# Patient Record
Sex: Female | Born: 1935 | Race: White | Hispanic: No | Smoking: Former smoker
Health system: Southern US, Community
[De-identification: ages and names within clinical notes are randomized; demographics above are authoritative.]

## PROBLEM LIST (undated history)

## (undated) DIAGNOSIS — J8410 Pulmonary fibrosis, unspecified: Secondary | ICD-10-CM

## (undated) DIAGNOSIS — M3500 Sicca syndrome, unspecified: Secondary | ICD-10-CM

## (undated) DIAGNOSIS — I10 Essential (primary) hypertension: Secondary | ICD-10-CM

## (undated) DIAGNOSIS — Z9049 Acquired absence of other specified parts of digestive tract: Secondary | ICD-10-CM

## (undated) DIAGNOSIS — K7460 Unspecified cirrhosis of liver: Secondary | ICD-10-CM

## (undated) DIAGNOSIS — M40209 Unspecified kyphosis, site unspecified: Secondary | ICD-10-CM

## (undated) HISTORY — DX: Unspecified cirrhosis of liver: K74.60

## (undated) HISTORY — DX: Acquired absence of other specified parts of digestive tract: Z90.49

## (undated) HISTORY — DX: Unspecified kyphosis, site unspecified: M40.209

## (undated) HISTORY — DX: Essential (primary) hypertension: I10

## (undated) HISTORY — DX: Sjogren syndrome, unspecified: M35.00

## (undated) HISTORY — DX: Pulmonary fibrosis, unspecified: J84.10

---

## 1996-01-21 HISTORY — PX: CATARACT EXTRACTION: SUR2

## 1996-01-21 HISTORY — PX: RETINAL DETACHMENT SURGERY: SHX105

## 2004-01-09 ENCOUNTER — Ambulatory Visit: Payer: Self-pay | Admitting: Unknown Physician Specialty

## 2004-02-15 ENCOUNTER — Ambulatory Visit: Payer: Self-pay | Admitting: Unknown Physician Specialty

## 2005-01-20 HISTORY — PX: BACK SURGERY: SHX140

## 2005-04-15 ENCOUNTER — Ambulatory Visit: Payer: Self-pay | Admitting: Unknown Physician Specialty

## 2005-08-25 ENCOUNTER — Ambulatory Visit: Payer: Self-pay | Admitting: Unknown Physician Specialty

## 2005-09-25 ENCOUNTER — Ambulatory Visit: Payer: Self-pay | Admitting: Pain Medicine

## 2005-10-01 ENCOUNTER — Encounter: Payer: Self-pay | Admitting: Neurological Surgery

## 2005-10-07 ENCOUNTER — Ambulatory Visit: Payer: Self-pay | Admitting: Pain Medicine

## 2005-10-20 ENCOUNTER — Encounter: Payer: Self-pay | Admitting: Neurological Surgery

## 2005-10-28 ENCOUNTER — Ambulatory Visit: Payer: Self-pay | Admitting: Neurological Surgery

## 2005-11-13 ENCOUNTER — Ambulatory Visit: Payer: Self-pay | Admitting: Neurological Surgery

## 2005-11-20 ENCOUNTER — Encounter: Payer: Self-pay | Admitting: Neurological Surgery

## 2005-12-05 ENCOUNTER — Inpatient Hospital Stay (HOSPITAL_COMMUNITY): Admission: RE | Admit: 2005-12-05 | Discharge: 2005-12-06 | Payer: Self-pay | Admitting: Neurological Surgery

## 2006-02-03 ENCOUNTER — Ambulatory Visit: Payer: Self-pay | Admitting: Ophthalmology

## 2006-02-10 ENCOUNTER — Ambulatory Visit: Payer: Self-pay | Admitting: Ophthalmology

## 2006-05-21 ENCOUNTER — Ambulatory Visit: Payer: Self-pay | Admitting: Unknown Physician Specialty

## 2006-07-14 ENCOUNTER — Ambulatory Visit: Payer: Self-pay | Admitting: Unknown Physician Specialty

## 2007-01-21 DIAGNOSIS — Z9049 Acquired absence of other specified parts of digestive tract: Secondary | ICD-10-CM

## 2007-01-21 HISTORY — PX: CHOLECYSTECTOMY: SHX55

## 2007-01-21 HISTORY — DX: Acquired absence of other specified parts of digestive tract: Z90.49

## 2007-05-01 IMAGING — CR DG LUMBAR SPINE 2-3V
2 series · 2 of 2 positions shown · non-contrast
Comparison: none

CLINICAL DATA: Stenosis.  L2-3 laminectomy.
 LUMBAR SPINE PORTABLE ? 2 VIEWS ? 12/05/05:

[view not recorded (1 of 2)]
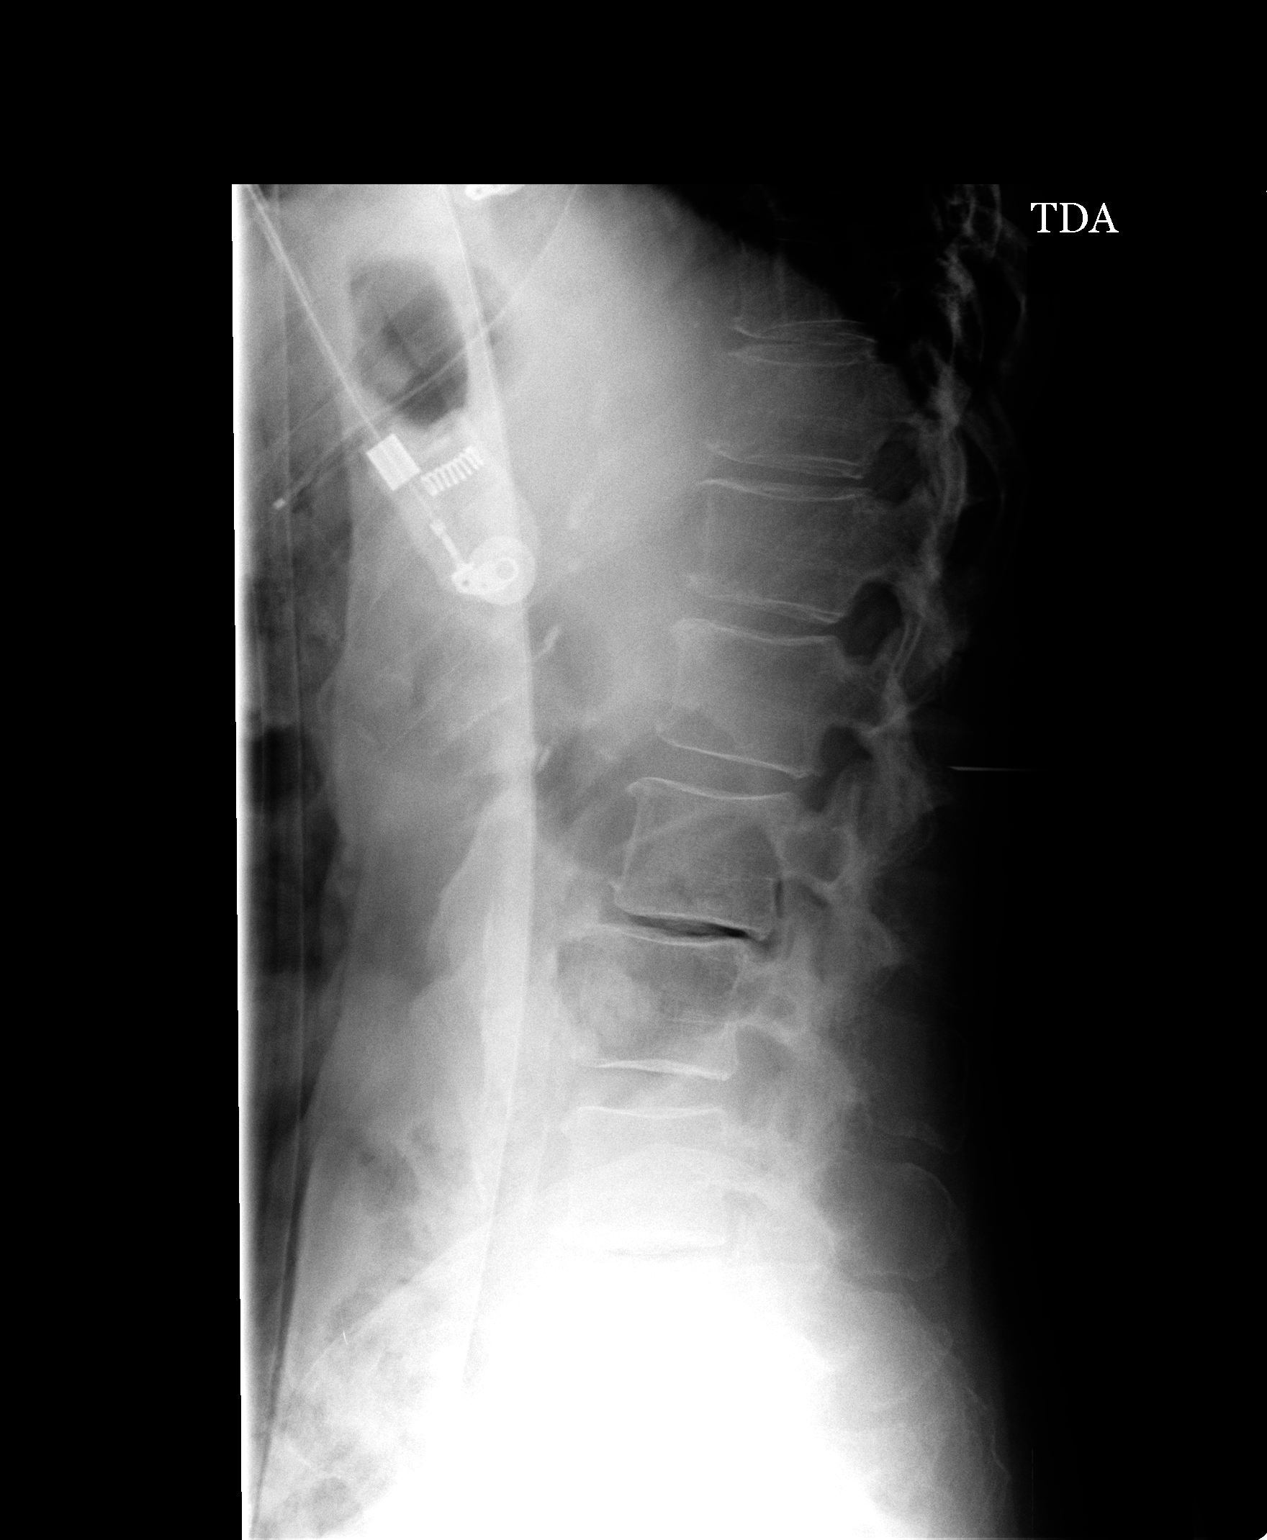

[view not recorded (2 of 2)]
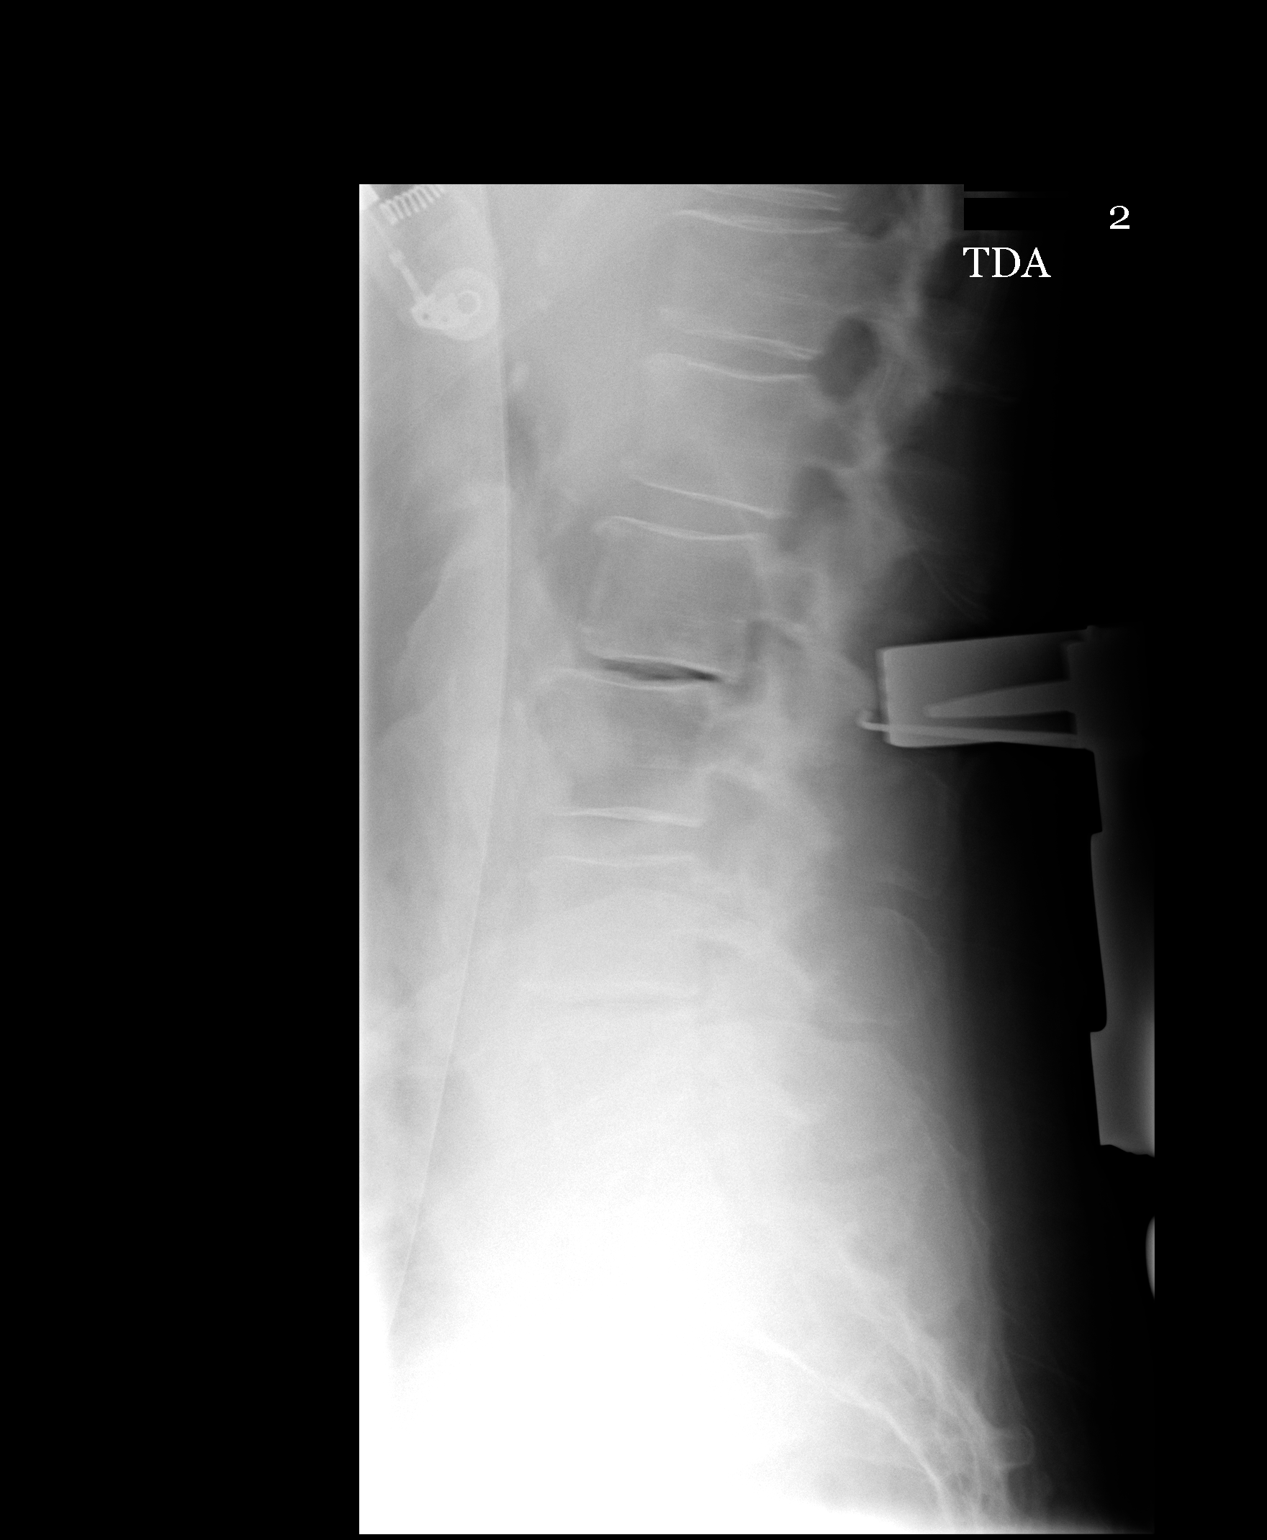

[2 of 2 positions shown; findings below may reference images not displayed]

FINDINGS: Film labeled #1 was taken at 9744 hours revealing a needle in the posterior soft tissues aimed at the inferior aspect of L1.
 Film labeled #2 was taken at 8083 hours revealing retractors in place with a surgical instrument pointer aimed at the L2-3 level.
IMPRESSION: OR localization at L2-3.

## 2007-05-24 ENCOUNTER — Ambulatory Visit: Payer: Self-pay | Admitting: Unknown Physician Specialty

## 2007-08-19 ENCOUNTER — Ambulatory Visit: Payer: Self-pay | Admitting: Unknown Physician Specialty

## 2007-09-30 ENCOUNTER — Ambulatory Visit: Payer: Self-pay | Admitting: Unknown Physician Specialty

## 2007-10-11 ENCOUNTER — Ambulatory Visit: Payer: Self-pay | Admitting: Cardiology

## 2007-10-11 ENCOUNTER — Other Ambulatory Visit: Payer: Self-pay

## 2007-10-11 ENCOUNTER — Ambulatory Visit: Payer: Self-pay | Admitting: Surgery

## 2007-10-15 ENCOUNTER — Ambulatory Visit: Payer: Self-pay | Admitting: Surgery

## 2008-01-25 ENCOUNTER — Ambulatory Visit: Payer: Self-pay | Admitting: Unknown Physician Specialty

## 2008-02-03 ENCOUNTER — Ambulatory Visit: Payer: Self-pay | Admitting: Unknown Physician Specialty

## 2008-02-23 LAB — PROTIME-INR

## 2008-05-24 ENCOUNTER — Encounter: Payer: Self-pay | Admitting: Internal Medicine

## 2008-05-24 ENCOUNTER — Ambulatory Visit: Payer: Self-pay | Admitting: Unknown Physician Specialty

## 2008-05-30 ENCOUNTER — Ambulatory Visit: Payer: Self-pay | Admitting: Unknown Physician Specialty

## 2008-09-06 ENCOUNTER — Ambulatory Visit: Payer: Self-pay | Admitting: Unknown Physician Specialty

## 2008-12-12 ENCOUNTER — Ambulatory Visit: Payer: Self-pay | Admitting: Unknown Physician Specialty

## 2008-12-29 ENCOUNTER — Encounter: Payer: Self-pay | Admitting: Unknown Physician Specialty

## 2009-01-20 ENCOUNTER — Encounter: Payer: Self-pay | Admitting: Unknown Physician Specialty

## 2009-01-20 HISTORY — PX: OTHER SURGICAL HISTORY: SHX169

## 2009-02-20 ENCOUNTER — Encounter: Payer: Self-pay | Admitting: Unknown Physician Specialty

## 2009-03-07 ENCOUNTER — Ambulatory Visit: Payer: Self-pay | Admitting: Unknown Physician Specialty

## 2009-05-15 ENCOUNTER — Encounter: Payer: Self-pay | Admitting: Internal Medicine

## 2009-05-17 ENCOUNTER — Ambulatory Visit: Payer: Self-pay | Admitting: Unknown Physician Specialty

## 2009-05-24 ENCOUNTER — Ambulatory Visit: Payer: Self-pay | Admitting: Unknown Physician Specialty

## 2009-06-22 ENCOUNTER — Ambulatory Visit: Payer: Self-pay | Admitting: Unknown Physician Specialty

## 2009-06-26 ENCOUNTER — Encounter: Payer: Self-pay | Admitting: Unknown Physician Specialty

## 2009-07-03 ENCOUNTER — Ambulatory Visit: Payer: Self-pay | Admitting: Unknown Physician Specialty

## 2009-07-10 ENCOUNTER — Ambulatory Visit: Payer: Self-pay | Admitting: Unknown Physician Specialty

## 2009-08-09 ENCOUNTER — Ambulatory Visit: Payer: Self-pay | Admitting: Unknown Physician Specialty

## 2009-08-18 ENCOUNTER — Ambulatory Visit: Payer: Self-pay | Admitting: Unknown Physician Specialty

## 2009-08-23 ENCOUNTER — Ambulatory Visit: Payer: Self-pay | Admitting: Unknown Physician Specialty

## 2009-11-20 ENCOUNTER — Encounter: Payer: Self-pay | Admitting: Unknown Physician Specialty

## 2009-12-20 ENCOUNTER — Encounter: Payer: Self-pay | Admitting: Unknown Physician Specialty

## 2009-12-20 ENCOUNTER — Encounter: Payer: Self-pay | Admitting: Internal Medicine

## 2009-12-26 ENCOUNTER — Encounter: Payer: Self-pay | Admitting: Internal Medicine

## 2010-01-01 ENCOUNTER — Ambulatory Visit: Payer: Self-pay | Admitting: Internal Medicine

## 2010-01-01 ENCOUNTER — Telehealth: Payer: Self-pay | Admitting: Internal Medicine

## 2010-01-01 DIAGNOSIS — M810 Age-related osteoporosis without current pathological fracture: Secondary | ICD-10-CM | POA: Insufficient documentation

## 2010-01-01 DIAGNOSIS — K7460 Unspecified cirrhosis of liver: Secondary | ICD-10-CM | POA: Insufficient documentation

## 2010-01-01 DIAGNOSIS — I10 Essential (primary) hypertension: Secondary | ICD-10-CM | POA: Insufficient documentation

## 2010-01-20 HISTORY — PX: BRONCHOSCOPY: SUR163

## 2010-01-22 ENCOUNTER — Ambulatory Visit
Admission: RE | Admit: 2010-01-22 | Discharge: 2010-01-22 | Payer: Self-pay | Source: Home / Self Care | Attending: Internal Medicine | Admitting: Internal Medicine

## 2010-01-22 ENCOUNTER — Encounter: Payer: Self-pay | Admitting: Internal Medicine

## 2010-01-30 ENCOUNTER — Other Ambulatory Visit: Payer: Self-pay | Admitting: Internal Medicine

## 2010-01-30 ENCOUNTER — Encounter: Payer: Self-pay | Admitting: Internal Medicine

## 2010-01-30 ENCOUNTER — Ambulatory Visit: Payer: Self-pay | Admitting: Internal Medicine

## 2010-01-30 ENCOUNTER — Ambulatory Visit
Admission: RE | Admit: 2010-01-30 | Discharge: 2010-01-30 | Payer: Self-pay | Source: Home / Self Care | Attending: Internal Medicine | Admitting: Internal Medicine

## 2010-01-30 DIAGNOSIS — J8410 Pulmonary fibrosis, unspecified: Secondary | ICD-10-CM | POA: Insufficient documentation

## 2010-01-30 LAB — SEDIMENTATION RATE: Sed Rate: 42 mm/hr — ABNORMAL HIGH (ref 0–22)

## 2010-02-08 ENCOUNTER — Encounter: Payer: Self-pay | Admitting: Internal Medicine

## 2010-02-14 LAB — CONVERTED CEMR LAB
ANA Titer 1: NEGATIVE
Anti Nuclear Antibody(ANA): POSITIVE — AB
Rheumatoid fact SerPl-aCnc: 38 intl units/mL — ABNORMAL HIGH (ref <=14)
Scleroderma (Scl-70) (ENA) Antibody, IgG: <1 (ref <30)

## 2010-02-15 ENCOUNTER — Telehealth: Payer: Self-pay | Admitting: Internal Medicine

## 2010-02-21 ENCOUNTER — Encounter: Payer: Self-pay | Admitting: Internal Medicine

## 2010-02-21 NOTE — Assessment & Plan Note (Signed)
Summary: sob/cb   Visit Type:  Initial Consult Copy to:  Dr. Jenkins Rouge, Primary Provider/Referring Provider:  Dr. Jenkins Rouge, [facility]  CC:  Pulmonary Consult for SOB  x 2 years. .  History of Present Illness: 77 year old female. Almost non-smoker.  Back surgery in 2007. Uses walker afters/p kyphoplasty in June and August 2011. . c/o dyspnea x 2 years. Insidious onset. Gradually progresive esp last  6 months. Brought on by exertion - suspects 1/4 - 1/2 block distance. Relieved by rest and also advair to an extent.  Rates dyspnea as moderate. Hx positive for wheezing for last several months - taking advair for several months. Advair helps wheeze and dyspnea.  Denies assoicated cough, chest pain, hemoptysis, syncope, palpitations, edema.  Of note, had significant worsening of dyspnea (without fever or sputum but only a mild cough) on 12/20/2009 and was diagnosed with pneumonia/wheezing per hx (CXR - from outside -  LLL infiltrate on 12/20/2009 that was possibly there on 05/15/2009 but definitely new since 12/02/2005 and 05/25/2000) and Rx with 1 week prednisone ending 12/26/2009 and short course abx ending 12/29/2009. She feels she is better with above Rx  Of note, does admit to unintenional weight loss 30# since diagnosis of idiopathic cirrhosis in 2007  Preventive Screening-Counseling & Management  Alcohol-Tobacco     Smoking Status: quit     Year Started: 1945     Year Quit: 1949     Passive Smoke Counseling: not indicated; no passive smoke exposure  Current Medications (verified): 1)  Vitamin B-12 Injection .... Once Monthly 2)  Norvasc 2.5 Mg Tabs (Amlodipine Besylate) .... Take 1 Tablet By Mouth Once A Day 3)  Maxzide-25 37.5-25 Mg Tabs (Triamterene-Hctz) .... 1/2 Tablet Daily 4)  Calcium-Vitamin D 250-125 Mg-Unit Tabs (Calcium Carbonate-Vitamin D) .... Take 1 Tablet By Mouth Two Times A Day 5)  Preservision/lutein  Caps (Multiple Vitamins-Minerals) .... Take 1 Tablet  By Mouth Two Times A Day 6)  Advair Diskus 100-50 Mcg/dose Aepb (Fluticasone-Salmeterol) .... One Puff Twice Daily 7)  Flonase 50 Mcg/act Susp (Fluticasone Propionate) .... As Needed 8)  Aleve 220 Mg Tabs (Naproxen Sodium) .... As Needed  Allergies (verified): No Known Drug Allergies  Past History:  Past Medical History: Osteoporosis  - IV reclast once  a year Cirrhosis.Marland KitchenMarland KitchenDr [facility]o [facility]ye [facility]  - Unknown etiolgoy. Bx proven   - On monitoring Rx Hypertension Back issues  Past Surgical History: back surgery-Dr. Marikay Alar, 2007 Retina repair-1998 Cataract 1998 Cholecystectomy-2009 Kyoplastic-2011  Family History: Father-diabetes Mother-diabetes Sister-died age 34 from complications with diabetes  Social History: Patient states former smoker.  started in 1945, stopped in  1949 avg  2 cigs per day.  Never ETOH. No drugs. Lives alone. No kids Widowed since 2000. Social support via siblings and nephew/neice Retired Charity fundraiser Smoking Status:  quit  Review of Systems       The patient complains of shortness of breath with activity, non-productive cough, and nasal congestion/difficulty breathing through nose.  The patient denies shortness of breath at rest, productive cough, coughing up blood, chest pain, irregular heartbeats, acid heartburn, indigestion, loss of appetite, weight change, abdominal pain, difficulty swallowing, sore throat, tooth/dental problems, headaches, sneezing, itching, ear ache, anxiety, depression, hand/feet swelling, joint stiffness or pain, rash, change in color of mucus, and fever.    Vital Signs:  Patient profile:   77 year old female Height:      65 inches Weight:      163.50 pounds BMI:  27.31 O2 Sat:      90 % on Room air Temp:     98.4 degrees F oral Pulse rate:   97 / minute BP sitting:   130 / 78  (right arm) Cuff size:   regular  Vitals Entered By: Carron Curie CMA (January 01, 2010 2:32 PM)  O2 Flow:  Room air  Serial Vital  Signs/Assessments:  Comments: Ambulatory Pulse Oximetry  Resting; HR__93___    02 Sat__91___  Lap1 (185 feet)   HR__88___   02 Sat___108__ Lap2 (185 feet)   HR_____   02 Sat_____    Lap3 (185 feet)   HR_____   02 Sat_____  ___Test Completed without Difficulty __x_Test Stopped due to:pt desaturate to 88% on first lap. Placed pt on 2 liters o2 and sats increased to 92%.   By: Carron Curie CMA   CC: Pulmonary Consult for SOB  x 2 years.  Comments Medications reviewed with patient Carron Curie CMA  January 01, 2010 2:42 PM Daytime phone number verified with patient.    Physical Exam  General:  well developed, well nourished, in no acute distress pleasant uses walker Head:  normocephalic and atraumatic Eyes:  PERRLA/EOM intact; conjunctiva and sclera clear Ears:  TMs intact and clear with normal canals Nose:  no deformity, discharge, inflammation, or lesions Mouth:  no deformity or lesions Neck:  no masses, thyromegaly, or abnormal cervical nodes Chest Wall:  no deformities noted Lungs:  decreased BS on L and dullness L base.   some squeeks and end exp wheeze on left base no distress Heart:  regular rate and rhythm, S1, S2 without murmurs, rubs, gallops, or clicks Abdomen:  bowel sounds positive; abdomen soft and non-tender without masses, or organomegaly Msk:  uses walker Pulses:  pulses normal Extremities:  no clubbing, cyanosis, edema, or deformity noted Neurologic:  CN II-XII grossly intact with normal reflexes, coordination, muscle strength and tone Skin:  intact without lesions or rashes Cervical Nodes:  no significant adenopathy Axillary Nodes:  no significant adenopathy Psych:  alert and cooperative; normal mood and affect; normal attention span and concentration   CXR  Procedure date:  12/20/2009  Findings:      (CXR - from outside -  LLL infiltrate on 12/20/2009 that was possibly there on 05/15/2009 but definitely new since 12/02/2005 and  05/25/2000  Impression & Recommendations:  Problem # 1:  DYSPNEA (ICD-786.05) Assessment New unclear cause in this is 77 year old almost non-smoker. Desaturates with exrtion.   plan full PFT Orders: Pulmonary Referral (Pulmonary) Radiology Referral (Radiology) Consultation Level V [phone])  Problem # 2:  UNSPECIFIED RESPIRATORY ABNORMALITY (ICD-786.00) Assessment: New I believe cxr shows non-resolving LLL infitlrate since April 2011 (my personal opinion). CXR was clear of these infitlrates in 2007 and 2002. She does not have dysphagia. She has cryptogenic cirrhosis but appears well comoensated. She  has 30# unitentional weight loss in 5 years  plan get ct chest wihtout contrast (depending on finding, will get SUPER D PROTOCOL Disc made if needed) rov after above Orders: Pulmonary Referral (Pulmonary) Radiology Referral (Radiology) Consultation Level V [id number])  Medications Added to Medication List This Visit: 1)  Vitamin B-12 Injection  .... Once monthly 2)  Norvasc 2.5 Mg Tabs (Amlodipine besylate) .... Take 1 tablet by mouth once a day 3)  Maxzide-25 37.5-25 Mg Tabs (Triamterene-hctz) .... 1/2 tablet daily 4)  Calcium-vitamin D 250-125 Mg-unit Tabs (Calcium carbonate-vitamin d) .... Take 1 tablet by mouth two times a day 5)  Preservision/lutein Caps (Multiple vitamins-minerals) .... Take 1 tablet by mouth two times a day 6)  Advair Diskus 100-50 Mcg/dose Aepb (Fluticasone-salmeterol) .... One puff twice daily 7)  Flonase 50 Mcg/act Susp (Fluticasone propionate) .... As needed 8)  Aleve 220 Mg Tabs (Naproxen sodium) .... As needed  Patient Instructions: 1)  pleaes have CT chest [facility] 2)  please have full PFTs 3)  return to see me after above in early Jan 2012 4)  Marvia Pickles and Happy New year 5)  If you feel  you are getting worse call us anytime at [phone]   Immunization History:  Influenza Immunization History:    Influenza:  historical  (11/20/2009)  Pneumovax Immunization History:    Pneumovax:  historical (09/23/2004)

## 2010-02-21 NOTE — Letter (Signed)
Summary: Francia Greaves MD/[facility]  Francia Greaves MD/[facility]   Imported By: Lester [place] 01/16/2010 07:50:00  _____________________________________________________________________  External Attachment:    Type:   Image     Comment:   External Document

## 2010-02-21 NOTE — Miscellaneous (Signed)
Summary: Orders Update pft charges  Clinical Lists Changes  Orders: Added new Service order of Carbon Monoxide diffusing w/capacity (94720) - Signed Added new Service order of Lung Volumes (94240) - Signed Added new Service order of Spirometry (Pre & Post) (94060) - Signed 

## 2010-02-21 NOTE — Assessment & Plan Note (Addendum)
Summary: F/U LLL INFILTRATE,SOB/RJC   Visit Type:  Follow-up Copy to:  Dr. Jenkins Rouge, Primary Provider/Referring Provider:  Dr. Jenkins Rouge, [facility]  CC:  Pt here for follow-up to review CT results and PFT results. .  History of Present Illness: 77 year old female. Almost non-smoker.  Idiopathich Cirrhosis since 2007 followed at [facility]. Chronic back issues. Uses walker since  s/p kyphoplasty in June and August 2011. .   c/o dyspnea x 2 years. Insidious onset. Gradually progresive esp last  6 months. Brought on by exertion - suspects 1/4 - 1/2 block distance. Relieved by rest and also advair to an extent.  Rates dyspnea as moderate. Hx positive for wheezing for last several months - taking advair for several months. Advair helps wheeze and dyspnea.  Had significant worsening of dyspnea (without fever or sputum but only a mild cough) on 12/20/2009 and was diagnosed with pneumonia/wheezing per hx. CXR - from outside shhowed  LLL infiltrate on 12/20/2009. Treated with antibipotic and prednisone short courses ending 12/26/2009. Of note, the LLL infiltrate was possibly present on  05/15/2009 and is  definitely new since 12/02/2005 and 05/25/2000). Denies assoicated cough, chest pain, hemoptysis, syncope, palpitations, edema but admits to 30# weight loss since 2007.    January 30, 2010: folowup after CT chest and PFTs. No interim complatins. PFTs today show NSIP pattern of pulmonary fibrosis iwth RUL nodule and other scattered nodules. I compared this with a May 2010 CT scan chest from [facility] and today's fibrois is worse. PFts 01/22/2010 shows mixed obstruction -restriction on spiro wiht TLC 78% and DLCO 28%. These findings are compatible with her symptoms.    Preventive Screening-Counseling & Management  Alcohol-Tobacco     Smoking Status: quit     Packs/Day: 0.5     Year Started: 1945     Year Quit: 1949     Passive Smoke Counseling: not indicated; no passive smoke exposure  Current  Medications (verified): 1)  Vitamin B-12 Injection .... Once Monthly 2)  Norvasc 2.5 Mg Tabs (Amlodipine Besylate) .... Take 1 Tablet By Mouth Once A Day 3)  Maxzide-25 37.5-25 Mg Tabs (Triamterene-Hctz) .... 1/2 Tablet Daily 4)  Calcium-Vitamin D 250-125 Mg-Unit Tabs (Calcium Carbonate-Vitamin D) .... Take 1 Tablet By Mouth Two Times A Day 5)  Preservision/lutein  Caps (Multiple Vitamins-Minerals) .... Take 1 Tablet By Mouth Two Times A Day 6)  Advair Diskus 100-50 Mcg/dose Aepb (Fluticasone-Salmeterol) .... One Puff Twice Daily 7)  Flonase 50 Mcg/act Susp (Fluticasone Propionate) .... As Needed 8)  Aleve 220 Mg Tabs (Naproxen Sodium) .... As Needed  Allergies (verified): No Known Drug Allergies  Past History:  Past medical, surgical, family and social histories (including risk factors) reviewed, and no changes noted (except as noted below).  Past Medical History: Osteoporosis  - IV reclast once  a year Cirrhosis.Marland KitchenMarland KitchenDr Margo [facility]ye at [facility]  - Unknown etiolgoy. Bx proven   - On monitoring Rx Hypertension Back issues with kyphosis  - s/p kyphoplasty   Past Surgical History: Reviewed history from 01/01/2010 and no changes required. back surgery-Dr. Marikay Alar, 2007 Retina repair-1998 Cataract 1998 Cholecystectomy-2009 Kyoplastic-2011  Family History: Reviewed history from 01/01/2010 and no changes required. Father-diabetes Mother-diabetes Sister-died age 62 from complications with diabetes  Social History: Reviewed history from 01/01/2010 and no changes required. Patient states former smoker.  started in 1945, stopped in  1949 avg  2 cigs per day.  Never ETOH. No drugs. Lives alone. No kids Widowed since 2000. Social support  via siblings and nephew/neice Retired Charity fundraiser Packs/Day:  0.5  Review of Systems  The patient denies shortness of breath with activity, shortness of breath at rest, productive cough, non-productive cough, coughing up blood, chest pain, irregular  heartbeats, acid heartburn, indigestion, loss of appetite, weight change, abdominal pain, difficulty swallowing, sore throat, tooth/dental problems, headaches, nasal congestion/difficulty breathing through nose, sneezing, itching, ear ache, anxiety, depression, hand/feet swelling, joint stiffness or pain, rash, change in color of mucus, and fever.    Vital Signs:  Patient profile:   77 year old female Height:      65 inches Weight:      162.50 pounds BMI:     27.14 O2 Sat:      94 % on Room air Temp:     97.4 degrees F oral Pulse rate:   72 / minute BP sitting:   140 / 82  (right arm) Cuff size:   regular  Vitals Entered By: Carron Curie CMA (January 30, 2010 2:03 PM)  O2 Flow:  Room air CC: Pt here for follow-up to review CT results and PFT results.  Comments Medications reviewed with patient Carron Curie CMA  January 30, 2010 2:06 PM Daytime phone number verified with patient.    Physical Exam  General:  well developed, well nourished, in no acute distress pleasant uses walker Head:  normocephalic and atraumatic Eyes:  PERRLA/EOM intact; conjunctiva and sclera clear Ears:  TMs intact and clear with normal canals Nose:  no deformity, discharge, inflammation, or lesions Mouth:  no deformity or lesions Neck:  no masses, thyromegaly, or abnormal cervical nodes Chest Wall:  no deformities noted Lungs:  decreased BS on L and dullness L base.   some squeeks and end exp wheeze on left base no distress Heart:  regular rate and rhythm, S1, S2 without murmurs, rubs, gallops, or clicks Abdomen:  bowel sounds positive; abdomen soft and non-tender without masses, or organomegaly Msk:  uses walker Pulses:  pulses normal Extremities:  no clubbing, cyanosis, edema, or deformity noted Neurologic:  CN II-XII grossly intact with normal reflexes, coordination, muscle strength and tone Skin:  intact without lesions or rashes Cervical Nodes:  no significant adenopathy Axillary  Nodes:  no significant adenopathy Psych:  alert and cooperative; normal mood and affect; normal attention span and concentration   MISC. Report  Procedure date:  01/30/2010  Findings:      pft and ct - see HPI  Impression & Recommendations:  Problem # 1:  PULMONARY FIBROSIS (ICD-515) Assessment New Clinically appears to have progressive diffuse parenchymal lung disease (dpld) or interstitial lung diseaes (ILD). The findings are NOT c/w UIP/IPF. Cause is not clear or precise type of fibrosis not clear.   PLAN woill get autoimune profile will d.w MTOC on 01/31/10 about potential bx options. Bx will help US reveal etiology but at age 37 and severe osteoporosis there is some risk for  VATS lung bx. Bronch bx (ENB method and BAL) less risky but yield also is less. Empiric steroid Rx or for that matter prolonged steroid Rx carries risk esp with age and osteoporosis. She is happy to go with whatever I recommend. So, I wil wait on autoimmune test results and also get our [facility] records for details oncirrhosis. Based on that review, I will consider Bronch in OR for biopsy and BAL  Other Orders: T-Angiotensin i-Converting Enzyme ([id number]) T-Antinuclear Antib (ANA) [phone]) T-Hypersens Panel ([id number]) T-Rheumatoid Factor [phone]) T- * Misc. Laboratory test [phone]) Radiology Referral (Radiology) TLB-Sedimentation Rate (  ESR) (85652-ESR) Est. Patient Level III (84696)  Patient Instructions: 1)  have blood work 2)  I will have discussion wiht other specialists 3)  I will call you back by early next week with answers 4)  followup depending on that

## 2010-02-21 NOTE — Miscellaneous (Signed)
Summary: records request  ---- Converted from flag ---- ---- 02/05/2010 5:32 PM, Carron Curie CMA wrote: contact numbers are [phone] or [phone]  ---- 02/04/2010 6:10 PM, Kalman Shan MD wrote: Dr Cornelia Copa at hematology dept of [facility]. Please get patient  INR, PT, PTT, most recent notes, liver biopsy report from this doctor ASAP. ------------------------------  request faxed to [facility] release of info at [phone], phone # is [phone]. request scanned.  Carron Curie CMA  February 08, 2010 2:36 PM

## 2010-02-21 NOTE — Progress Notes (Signed)
Summary: ct needs HRCT and need old one  Phone Note Outgoing Call   Summary of Call: revioewd oustide records. IT states that she had CT in MAy 2010  at [facility]. Please have her get that CD rom for me when she comes back for fu. also, this means that the CT we ordered needs to be HIGH RES CT CHEST. Pleaset tell [facility] Initial call taken by: Kalman Shan MD,  January 01, 2010 5:53 PM  Follow-up for Phone Call        spoke with the pt and advised she needed to get CD of CT from [facility] and bring to next appt. Pt states she will do so. I also called Rose and advised the CT for Jan needs to be a High Res CT chest. SHe has changed this in order.  Carron Curie CMA  January 03, 2010 3:42 PM

## 2010-02-22 ENCOUNTER — Telehealth (INDEPENDENT_AMBULATORY_CARE_PROVIDER_SITE_OTHER): Payer: Self-pay | Admitting: *Deleted

## 2010-02-27 ENCOUNTER — Encounter: Payer: Self-pay | Admitting: Internal Medicine

## 2010-02-27 NOTE — Progress Notes (Signed)
  Phone Note Other Incoming   Request: Send information Summary of Call: Request for records received from [facility]. Faxed 20 pages on 02/21/2010 to [phone].

## 2010-02-27 NOTE — Progress Notes (Signed)
Summary: set up appt with Dr. Saverio Danker  Phone Note Outgoing Call   Summary of Call: Candise Bowens,  a) need the records from [facility] b) lot of rheum antibidoes psoitive. I told patient that I want her to see her rheumatologoist again to interpret these tests. His name is Dr. Al Pimple of [facility]. You can have him call me on my cell anytime or page me with this contact coz I will be in ICU. Please call his office and make sure they give patient appt   Thanks MR Initial call taken by: Kalman Shan MD,  February 15, 2010 5:56 PM  Follow-up for Phone Call        I called [facility] at [phone] and asked to leave a message for Dr. Gavin Potters to call MR. Nurse states he does not sdo call backs that the MD will need to call [phone] and they will pull Dr. Gavin Potters out of the room to speak to him. ALso pt has an appt on 02-21-10 at 9 am.  I requested records from [facility] on 02-08-10. I will call to check on them later today. I have notified MR with contact number for Dr. Gavin Potters. Carron Curie CMA  February 18, 2010 9:35 AM   Additional Follow-up for Phone Call Additional follow up Details #1::        I spokle to Dr. Beverley Fiedler today and appraised him. He will review can call back with opinion. Please send all records including autoimmune profile results Additional Follow-up by: Kalman Shan MD,  February 19, 2010 3:39 PM    Additional Follow-up for Phone Call Additional follow up Details #2::    I refaxed request for records to [facility] again. i tried to call but was on hold for . Will try again. Carron Curie CMA  February 20, 2010 5:27 PM   Also spoke to dena in medical records and she is faxing all records to Dr. Gavin Potters now. Carron Curie CMA  February 21, 2010 2:32 PM

## 2010-03-04 ENCOUNTER — Telehealth: Payer: Self-pay | Admitting: Internal Medicine

## 2010-03-04 ENCOUNTER — Emergency Department: Payer: Self-pay | Admitting: Emergency Medicine

## 2010-03-06 ENCOUNTER — Encounter: Payer: Self-pay | Admitting: Internal Medicine

## 2010-03-12 ENCOUNTER — Ambulatory Visit: Payer: Self-pay

## 2010-03-13 NOTE — Progress Notes (Signed)
Summary: send autoimmne results to Dr. Gavin Potters > appt w/ MR 2.27.12  Phone Note Outgoing Call   Summary of Call: Candise Bowens, I was reading rheum notes of Dr Saverio Danker. he wants me to see her and discuss Rx. Pls give her appt to see me. No overbook. First avail is fine Initial call taken by: Kalman Shan MD,  March 04, 2010 2:14 AM  Follow-up for Phone Call        called spoke with patient, appt scheduled w/ MR 2.27.12 @ 1045.  pt okay with this date and time. Boone Master CNA/MA  March 05, 2010 5:12 PM

## 2010-03-13 NOTE — Letter (Signed)
Summary: Danielle Dess MD  Danielle Dess MD   Imported By: Lester [place] 03/08/2010 10:04:23  _____________________________________________________________________  External Attachment:    Type:   Image     Comment:   External Document

## 2010-03-13 NOTE — Letter (Signed)
Summary: Danielle Dess MD  Danielle Dess MD   Imported By: Lester [place] 03/08/2010 10:03:00  _____________________________________________________________________  External Attachment:    Type:   Image     Comment:   External Document

## 2010-03-18 ENCOUNTER — Encounter: Payer: Self-pay | Admitting: Internal Medicine

## 2010-03-18 ENCOUNTER — Ambulatory Visit (INDEPENDENT_AMBULATORY_CARE_PROVIDER_SITE_OTHER): Payer: Medicare Other | Admitting: Internal Medicine

## 2010-03-18 DIAGNOSIS — J8410 Pulmonary fibrosis, unspecified: Secondary | ICD-10-CM

## 2010-03-20 ENCOUNTER — Encounter: Payer: Self-pay | Admitting: Internal Medicine

## 2010-03-28 NOTE — Assessment & Plan Note (Addendum)
Summary: follow up per Dr. Earlene Plater and MR / [name]   Visit Type:  Follow-up Copy to:  Dr. Jenkins Rouge, Primary Provider/Referring Provider:  Dr. Jenkins Rouge, [facility], Dr Jones Broom- spine. Dr. Marchelle Gearing - Pulmonary, Dr. Mikey Bussing - Eye  CC:  follow-up to discuss appt with Dr. Gavin Potters. [name]  History of Present Illness: 77 year old female. Almost non-smoker.  Idiopathich Cirrhosis since 2007 followed at [facility]. Chronic back issues. Uses walker since  s/p kyphoplasty in June and August 2011. .   c/o dyspnea x 2 years. Insidious onset. Gradually progresive esp last  6 months. Brought on by exertion - suspects 1/4 - 1/2 block distance. Relieved by rest and also advair to an extent.  Rates dyspnea as moderate. Hx positive for wheezing for last several months - taking advair for several months. Advair helps wheeze and dyspnea.  Had significant worsening of dyspnea (without fever or sputum but only a mild cough) on 12/20/2009 and was diagnosed with pneumonia/wheezing per hx. CXR - from outside shhowed  LLL infiltrate on 12/20/2009. Treated with antibipotic and prednisone short courses ending 12/26/2009. Of note, the LLL infiltrate was possibly present on  05/15/2009 and is  definitely new since 12/02/2005 and 05/25/2000). Denies assoicated cough, chest pain, hemoptysis, syncope, palpitations, edema but admits to 30# weight loss since 2007.    January 30, 2010: folowup after CT chest and PFTs. No interim complatins. PFTs today show NSIP pattern of pulmonary fibrosis iwth RUL nodule and other scattered nodules. I compared this with a May 2010 CT scan chest from [facility] and today's fibrois is worse. PFts 01/22/2010 shows mixed obstruction -restriction on spiro wiht TLC 78% and DLCO 28%. These findings are compatible with her symptoms.  REC: AUTOIMMUNE PROFILE    March 18, 2010: Follwoup ILD. Results show ,p-ANCA trace  positive at 1:20, RF 38 and positive, ACE >100 and positive, ANA POS but titer negative,  ssA strong positive at 175 but ssB negative. Fungal antigen panel negative. She saw her rheumatologist Dr. Gavin Potters on 02/27/2010 - she denied sicca symptims and he felt that there was no other systemic manifestation of collagen vasc disease. He did feel that prednisone would hurt her osteoporosis. Today, she states persistent dyspnea and increased tirendess. Interim, one mo0re spontaneous vertebral fracture. Otherwise no issues but admits to chronic dry eyes and usage of artifical tears multiople times a day. Also, several mnths of dry mouth and need to drink lot of water. [name] Here to discuss next step in ILD workup and mgmt  Preventive Screening-Counseling & Management  Alcohol-Tobacco     Smoking Status: quit     Packs/Day: 0.5     Year Started: 1945     Year Quit: 1949     Passive Smoke Counseling: not indicated; no passive smoke exposure  Current Medications (verified): 1)  Vitamin B-12 Injection .... Once Monthly 2)  Norvasc 2.5 Mg Tabs (Amlodipine Besylate) .... Take 1 Tablet By Mouth Once A Day 3)  Maxzide-25 37.5-25 Mg Tabs (Triamterene-Hctz) .... 1/2 Tablet Daily As Needed For Ankle Swelling 4)  Calcium-Vitamin D 250-125 Mg-Unit Tabs (Calcium Carbonate-Vitamin D) .... Take 1 Tablet By Mouth Two Times A Day 5)  Preservision/lutein  Caps (Multiple Vitamins-Minerals) .... Take 1 Tablet By Mouth Two Times A Day 6)  Advair Diskus 100-50 Mcg/dose Aepb (Fluticasone-Salmeterol) .... One Puff Twice Daily 7)  Flonase 50 Mcg/act Susp (Fluticasone Propionate) .... As Needed 8)  Aleve 220 Mg Tabs (Naproxen Sodium) .... As Needed 9)  Hydrocodone-Acetaminophen 5-500 Mg Tabs (Hydrocodone-Acetaminophen) .[name].. 1-2 Tablets As Needed For Pain 10)  Cvs Gentle Lubricant Eye Drops 0.3 % Soln (Hypromellose) .... As Needed  Allergies (verified): No Known Drug Allergies  Past History:  Past medical, surgical, family and social histories (including risk factors) reviewed, and no changes noted (except as noted  below).  Past Medical History: Osteoporosis  - IV reclast once  a year Cirrhosis.Debbie KitchenMarland KitchenDr Margo Aye at [facility]  - Unknown etiolgoy. Bx proven   - On monitoring Rx Hypertension Back issues with kyphosis  - s/p kyphoplasty x 4  Past Surgical History: Reviewed history from 01/01/2010 and no changes required. back surgery-Dr. Marikay Alar, 2007 Retina repair-1998 Cataract 1998 Cholecystectomy-2009 Kyoplastic-2011  Family History: Reviewed history from 01/01/2010 and no changes required. Father-diabetes Mother-diabetes Sister-died age 31 from complications with diabetes  Social History: Reviewed history from 01/01/2010 and no changes required. Patient states former smoker.  started in 1945, stopped in  1949 avg  2 cigs per day.  Never ETOH. No drugs. Lives alone. No kids Widowed since 2000. Social support via siblings and nephew/neice Retired Charity fundraiser  Review of Systems       The patient complains of shortness of breath with activity and hand/feet swelling.  The patient denies shortness of breath at rest, productive cough, non-productive cough, coughing up blood, chest pain, irregular heartbeats, acid heartburn, indigestion, loss of appetite, weight change, abdominal pain, difficulty swallowing, sore throat, tooth/dental problems, headaches, nasal congestion/difficulty breathing through nose, sneezing, itching, ear ache, anxiety, depression, joint stiffness or pain, rash, change in color of mucus, and fever.         dry mouth and eyes  Vital Signs:  Patient profile:   77 year old female Height:      65 inches Weight:      156.50 pounds BMI:     26.14 O2 Sat:      87 % on Room air Temp:     98.2 degrees F oral Pulse rate:   91 / minute BP sitting:   134 / 70  (right arm) Cuff size:   regular  Vitals Entered By: Carron Curie CMA (March 18, 2010 10:45 AM)  O2 Flow:  Room air  O2 Sat Comments upon arrival pt sats were 87% in room air. Instructed pt to do pursed lip breathing  and sats increased to 90%, HR 87. Carron Curie CMA  March 18, 2010 10:51 AM  CC: follow-up to discuss appt with Dr. Gavin Potters.  Comments Medications reviewed with patient Carron Curie CMA  March 18, 2010 10:46 AM Daytime phone number verified with patient.    Physical Exam  General:  well developed, well nourished, in no acute distress pleasant uses walker Head:  normocephalic and atraumatic Eyes:  PERRLA/EOM intact; conjunctiva and sclera clear Ears:  TMs intact and clear with normal canals Nose:  no deformity, discharge, inflammation, or lesions Mouth:  no deformity or lesions Neck:  no masses, thyromegaly, or abnormal cervical nodes Chest Wall:  no deformities noted Lungs:  decreased BS on L and dullness L base.   some squeeks and end exp wheeze on left base no distress Heart:  regular rate and rhythm, S1, S2 without murmurs, rubs, gallops, or clicks Abdomen:  bowel sounds positive; abdomen soft and non-tender without masses, or organomegaly Msk:  uses walker Pulses:  pulses normal Extremities:  no clubbing, cyanosis, edema, or deformity noted Neurologic:  CN II-XII grossly intact with normal reflexes, coordination, muscle strength and tone Skin:  intact  without lesions or rashes Cervical Nodes:  no significant adenopathy Axillary Nodes:  no significant adenopathy Psych:  alert and cooperative; normal mood and affect; normal attention span and concentration   Impression & Recommendations:  Problem # 1:  PULMONARY FIBROSIS (ICD-515) Assessment Unchanged Autoimmune 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 Given dry eyes, dry mouth, and NSIP pattern and strong titer ssA positive I suspect SJogren syndrome.  PLAN - set up o2 -refer to her eye doctor Dr Mikey Bussing to do diagnostic test for Sjogren eyes -Will d/w her Liver doc at [facility] and Spine doc Dr. Gerrit Heck about giving her a trial of prednisone - have emailed him -She is aware of risks of steroids  versus risks of progressive pulmnary fibrosis due to above and getting hypoxeic. She does not like either option so I have recommended a middle ground of few months of pred trial +/- immuran - Will need bronch bal before prred trial - No need for lung bx due to risks and sense that this is due to Sjogren  Her sister and neice aware  35 minutes of this 40 minute encounter was spent counselling  Medications Added to Medication List This Visit: 1)  Maxzide-25 37.5-25 Mg Tabs (Triamterene-hctz) .... 1/2 tablet daily as needed for ankle swelling 2)  Hydrocodone-acetaminophen 5-500 Mg Tabs (Hydrocodone-acetaminophen) .[name].. 1-2 tablets as needed for pain 3)  Cvs Gentle Lubricant Eye Drops 0.3 % Soln (Hypromellose) .... As needed  Other Orders: DME Referral (DME) Ophthalmology Referral (Ophthalmology) Est. Patient Level IV (59563)  Patient Instructions: 1)  I think you have Sjogren syndrome 2)  please see your eye doctor Dr Coralee North 3)   - I will get in touch wiht him 4)  I will be in touch with Dr. Ruthann Cancer 5)  I will talk to your liver doctor 6)  will give you a call after that 7)  you likely need bronchoscopy 8)  you need home oyxgen

## 2010-03-29 ENCOUNTER — Telehealth: Payer: Self-pay | Admitting: Internal Medicine

## 2010-04-02 ENCOUNTER — Encounter (INDEPENDENT_AMBULATORY_CARE_PROVIDER_SITE_OTHER): Payer: Self-pay | Admitting: *Deleted

## 2010-04-02 ENCOUNTER — Other Ambulatory Visit: Payer: Medicare Other

## 2010-04-02 ENCOUNTER — Other Ambulatory Visit: Payer: Self-pay | Admitting: Internal Medicine

## 2010-04-02 DIAGNOSIS — R0602 Shortness of breath: Secondary | ICD-10-CM

## 2010-04-02 LAB — CBC WITH DIFFERENTIAL/PLATELET
Eosinophils Relative: 1.3 % (ref 0.0–5.0)
Monocytes Absolute: 0.6 10*3/uL (ref 0.1–1.0)
Monocytes Relative: 11.1 % (ref 3.0–12.0)
Neutrophils Relative %: 57.9 % (ref 43.0–77.0)
Platelets: 130 10*3/uL — ABNORMAL LOW (ref 150.0–400.0)
WBC: 5.3 10*3/uL (ref 4.5–10.5)

## 2010-04-02 LAB — PROTIME-INR
INR: 1.3 ratio — ABNORMAL HIGH (ref 0.8–1.0)
Prothrombin Time: 13.8 s — ABNORMAL HIGH (ref 10.2–12.4)

## 2010-04-05 ENCOUNTER — Ambulatory Visit (HOSPITAL_COMMUNITY)
Admission: RE | Admit: 2010-04-05 | Discharge: 2010-04-05 | Disposition: A | Discharge status: Home / Self Care | Payer: Medicare Other | Source: Ambulatory Visit | Attending: Internal Medicine | Admitting: Internal Medicine

## 2010-04-05 ENCOUNTER — Other Ambulatory Visit: Payer: Self-pay | Admitting: Internal Medicine

## 2010-04-05 ENCOUNTER — Telehealth: Payer: Self-pay | Admitting: Internal Medicine

## 2010-04-05 DIAGNOSIS — R0902 Hypoxemia: Secondary | ICD-10-CM | POA: Insufficient documentation

## 2010-04-05 DIAGNOSIS — M3500 Sicca syndrome, unspecified: Secondary | ICD-10-CM

## 2010-04-05 DIAGNOSIS — R0602 Shortness of breath: Secondary | ICD-10-CM

## 2010-04-05 DIAGNOSIS — J8410 Pulmonary fibrosis, unspecified: Secondary | ICD-10-CM

## 2010-04-05 DIAGNOSIS — J984 Other disorders of lung: Secondary | ICD-10-CM | POA: Insufficient documentation

## 2010-04-05 DIAGNOSIS — Z01818 Encounter for other preprocedural examination: Secondary | ICD-10-CM | POA: Insufficient documentation

## 2010-04-05 LAB — BODY FLUID CELL COUNT WITH DIFFERENTIAL
Eos, Fluid: 0 %
Monocyte-Macrophage-Serous Fluid: 89 % (ref 50–90)
Total Nucleated Cell Count, Fluid: 60 cu mm (ref 0–1000)

## 2010-04-05 LAB — PNEUMOCYSTIS JIROVECI SMEAR BY DFA: Pneumocystis jiroveci Ag: NEGATIVE

## 2010-04-09 NOTE — Progress Notes (Signed)
Summary: bal bronch needed<<<pt aware  Phone Note Outgoing Call   Summary of Call: called patient. I spoke a week ago or so to her Liver doc at [facility] - she says cirrhosis is cryptigenic and unrelated to lung dz She is okay with pred trial. I spoke to dR. Califf he is okay with pred for a few months if ILD is a pressing issue. I conveyed this to her. She has agreed to bronch with BAL (she feels weaker now overall) and possibly transbronch bx if things go well.   She has seen her eye doctor who reportedly agrees with Sjorgren diagnosis. I  Need to see if his letter is in my stack. If not you have to get it. I will let you know  I can do bronch this friday anytome at [facility]. Arrange for BAL and transbronchial bx. Needs coag and CBCD this week. Small scope. Fluor and no tb risk. Biopsy only if she tolerates first part of procedure okay Initial call taken by: Kalman Shan MD,  March 29, 2010 5:16 PM  Follow-up for Phone Call        Spoke with pt and advsied that I will call her tomorrow and let her know time of bronch for friday because bronch lab closed for today. I also advised she needs to come get labs drawn this week as well. She staets she will come by tomorrow for labs. Order placed. Carron Curie CMA  April 01, 2010 4:32 PM  Bronch set for Friday at 10am at [facility]. [id number] to advise the pt. Carron Curie CMA  April 02, 2010 1:00 PM   Additional Follow-up for Phone Call Additional follow up Details #1::        Pt is aware of Bronch for Fri., 04/05/2010 @ 10am. Pt to arrive at 8:45am @ short stay at [facility]. NPO after midnight and someone will need to be with her to drive after procedure. Pt had verbal understanding of this.Michel Bickers CMA  April 02, 2010 3:51 PM

## 2010-04-10 LAB — CULTURE, RESPIRATORY W GRAM STAIN

## 2010-04-11 LAB — LEGIONELLA PROFILE(CULTURE+DFA/SMEAR)

## 2010-04-14 NOTE — Op Note (Signed)
[name], [name]                ACCOUNT NO.:  [id number]  MEDICAL RECORD NO.:  [id number]           PATIENT TYPE:  O  LOCATION:  XRAY                         FACILITY:  [facility]  PHYSICIAN:  Kalman Shan, MD   DATE OF BIRTH:  March 30, 1923  DATE OF PROCEDURE:  04/05/2010 DATE OF DISCHARGE:  04/05/2010                              OPERATIVE REPORT   DATE OF PROCEDURE:  April 05, 2010.  TYPE OF PROCEDURE:  Bronchoscopy with bronchoalveolar lavage and transbronchial biopsies.  INDICATIONS: 1. Interstitial lung disease. 2. Exertional hypoxemia. 3. Restrictive lung disease. 4. Likely Sjogren syndrome with Sicca syndrome.  PREPROCEDURE ASSESSMENT:  The history and physical is less than 73 days old.  Recent laboratory test showed a normal creatinine, but a low albumin, INR of 1.3, platelet count of over 100,000.  She is obese, kyphotic, has severe osteoporosis and vertebral fractures.  The patient was considered moderate risk for sedation, but was thought could go undergo for sedation and biopsies.  She was fully aware of the side effects of pneumothorax, bleeding, sedation complications, cardiac arrest, respiratory arrest, stroke.  Limitations of the procedure including nondiagnostic results was explained.  She was willing to accept the risks, complications, the potential benefits and the limitations and was willing to undergo the procedure.  CONSENT:  Signed informed consent.  SEDATION PLAN:  Fentanyl, Versed for moderate sedation and topical lidocaine for anesthesia and fluoroscopy for transbronchial biopsies.  PROCEDURE NOTE:  After induction of moderate sedation with low dose of fentanyl and Versed, the small bronchoscope was passed through the left naris.  It was easily navigated.  The epiglottis, false vocal cords, true vocal cords and arytenoids all looked normal, although little bit dry.  After application of enough lidocaine, the trachea was entered. The tracheal  rings looked normal and sharp.  The carina looked normal and sharp.  Attention was turned to the left side, a quick airway exam on the left side including the left main bronchus, left upper lobe bronchus and the lingula and the subsegments, left lower lobe subsegments all looked normal, except for the fact the airway was extremely dry without any mucus.  After application of lidocaine, the left lower lobe lateral segment was wedged and 40 mL x3 of bronchoalveolar lavage was done with grayish returns of lavage material. This was collected and saved for analysis.  After this transbronchial biopsies x4 in the left lower lobe lateral segment 2 pieces and then left lower lobe inferior segment 2 biopsy pieces were performed under fluoroscopy with return of some material fluoroscopy, time was 1 minute and 16 seconds.  The bronchoscope was then withdrawn to the carina. Attention was turned to the right side, a right-sided airway exam was performed and the right main bronchus, bronchus intermedius, right upper lobe takeoff and subsegments, right middle lobe subsegments, right lower lobe subsegments were all normal, except for the fact airway was extremely dry.  IMPRESSION: 1. Dry airways. 2. Otherwise, normal exam. 3. Status post bronchoalveolar lavage of left lower lobe lateral     segment. 4. Status post transbronchial biopsies of left lower lobe.  POSTPROCEDURE PLAN:  1. Recover in the recovery suite 2. Get a portable chest x-ray to rule out pneumothorax. 3. Niece and father-in-law were updated 4. Follow up with the Outpatient Clinic to discuss diagnosis and     potential steroid therapy.  COMPLICATIONS.:  None.  SPECIAL NOTES:  Total fentanyl given 75 mcg, Versed given 2 mg and fluoroscopy time was 1 minute 16 seconds.     Kalman Shan, MD     MR/MEDQ  D:  04/05/2010  T:  04/06/2010  Job:  [id number]  Electronically Signed by Kalman Shan MD on 04/14/2010 08:53:10 PM

## 2010-04-15 ENCOUNTER — Encounter: Payer: Self-pay | Admitting: Internal Medicine

## 2010-04-16 ENCOUNTER — Encounter: Payer: Self-pay | Admitting: Internal Medicine

## 2010-04-16 ENCOUNTER — Ambulatory Visit (INDEPENDENT_AMBULATORY_CARE_PROVIDER_SITE_OTHER): Payer: Medicare Other | Admitting: Internal Medicine

## 2010-04-16 DIAGNOSIS — A498 Other bacterial infections of unspecified site: Secondary | ICD-10-CM

## 2010-04-16 DIAGNOSIS — M3500 Sicca syndrome, unspecified: Secondary | ICD-10-CM

## 2010-04-16 DIAGNOSIS — J84115 Respiratory bronchiolitis interstitial lung disease: Secondary | ICD-10-CM

## 2010-04-16 DIAGNOSIS — J8410 Pulmonary fibrosis, unspecified: Secondary | ICD-10-CM

## 2010-04-16 DIAGNOSIS — J849 Interstitial pulmonary disease, unspecified: Secondary | ICD-10-CM

## 2010-04-16 DIAGNOSIS — K7460 Unspecified cirrhosis of liver: Secondary | ICD-10-CM

## 2010-04-16 DIAGNOSIS — B965 Pseudomonas (aeruginosa) (mallei) (pseudomallei) as the cause of diseases classified elsewhere: Secondary | ICD-10-CM

## 2010-04-16 MED ORDER — PREDNISONE 10 MG PO TABS: 30.0000 mg | ORAL_TABLET | Freq: Every day | ORAL | Status: DC

## 2010-04-16 MED ORDER — CIPROFLOXACIN HCL 500 MG PO TABS: 500.0000 mg | ORAL_TABLET | Freq: Two times a day (BID) | ORAL | Status: AC

## 2010-04-16 NOTE — Patient Instructions (Signed)
Take ciprofloxacin as directed for 10 days  - if you feel faint of having pain in ankles or worsening joint pain let us know Take prednisone 30mg  daily   - if you are feeling side effects let us know Return in 3 weeks Will do walking test with exertion at return At followup will discuss starting immuran treatment - I will discuss with Dr. Gavin Potters Continue to wear oxygen

## 2010-04-17 ENCOUNTER — Encounter: Payer: Self-pay | Admitting: Internal Medicine

## 2010-04-17 DIAGNOSIS — M3500 Sicca syndrome, unspecified: Secondary | ICD-10-CM | POA: Insufficient documentation

## 2010-04-17 NOTE — Progress Notes (Signed)
Subjective:    Patient ID: [name], female    DOB: [birth year], 77 y.o.   MRN: [id number]  HPI Amost non-smoker.  Idiopathich Cirrhosis since 2007 followed at [facility]. Chronic back issues. Uses walker since  s/p kyphoplasty in June and August 2011. ILD due to Sjogren - new diagnosis Feb 2012  This is a post bronch followup. Bronch results show few colonies of sensitive pseudomonas on culture (? Contaminant) but otherwise revealed normal but dry airways. BAL showed macrophage predominant (normal) and biopsy showed some non specific fibrosis. She has no new interim complaints. Here today to mainly discuss Rx options. STeroid side effects have already been explained. Alternative of progressive hypoxemia also has been explained. She has questions about quality and quantity of life.   I have d/w Dr Gerrit Heck who is okay with short course of steroids. Dr. Huston Foley her hepatologist at [facility] has  Expressed reservation about steroids in her notes.  Past Medical History  Diagnosis Date  . Osteoporosis     iv reclast once a year  . Cirrhosis     Dr Mills Koller at [facility]  . Hypertension   . Kyphosis     with back issues s/p kyphoplasty x 4  . Cataract 1998  . Hx of cholecystectomy 2009  . Pulmonary fibrosis   . Sjogren's syndrome Diagnosed Feb 2012     Family History  Problem Relation Age of Onset  . Diabetes Mother   . Diabetes Father   . Diabetes Sister      History   Social History  . Marital Status: Widowed    Spouse Name: N/A    Number of Children: N/A  . Years of Education: N/A   Occupational History  . retired Charity fundraiser    Social History Main Topics  . Smoking status: Former Smoker -- 0.1 packs/day for 4 years    Types: Cigarettes    Quit date: 01/21/1947  . Smokeless tobacco: Not on file  . Alcohol Use: No  . Drug Use: No  . Sexually Active: Not on file   Other Topics Concern  . Not on file   Social History Narrative  . No narrative on file     No Known Allergies   Outpatient  Prescriptions Prior to Visit  Medication Sig Dispense Refill  . amLODipine (NORVASC) 2.5 MG tablet Take 2.5 mg by mouth daily.        . calcium-vitamin D (OSCAL WITH D) 250-125 MG-UNIT per tablet Take 1 tablet by mouth 2 (two) times daily.        . Cyanocobalamin (VITAMIN B-12 IJ) Inject as directed. Once a month       . fluticasone (FLONASE) 50 MCG/ACT nasal spray 2 sprays by Nasal route as needed.        . Fluticasone-Salmeterol (ADVAIR DISKUS) 100-50 MCG/DOSE AEPB Inhale 1 puff into the lungs every 12 (twelve) hours.        Marland Kitchen HYDROcodone-acetaminophen (VICODIN) 5-500 MG per tablet Take 1-2 tablets by mouth as needed. For pain       . Hypromellose (CVS GENTLE LUBRICANT EYE DROPS) 0.3 % SOLN Apply to eye as needed.        . Multiple Vitamins-Minerals (PRESERVISION/LUTEIN) CAPS Take 1 capsule by mouth 2 (two) times daily after a meal.        . naproxen sodium (ANAPROX) 220 MG tablet Take 220 mg by mouth as needed.        . triamterene-hydrochlorothiazide (MAXZIDE-25) 37.5-25 MG per tablet Take  by mouth daily. 1/2 tablet daily as needed for ankle swelling             Review of Systems  Constitutional: Negative.   HENT: Negative.   Eyes:       Dry eyes  Respiratory: Positive for cough, chest tightness, shortness of breath and wheezing.   Cardiovascular: Negative.   Gastrointestinal: Negative.   Musculoskeletal: Positive for back pain and gait problem.       Uses walker. S/p kyphoplasty x 4-5  Skin: Negative.   Neurological: Negative.   Hematological: Negative.   Psychiatric/Behavioral: Negative.        Objective:   Physical Exam  Nursing note and vitals reviewed. Constitutional: She is oriented to person, place, and time. She appears well-developed and well-nourished. No distress.  HENT:  Head: Normocephalic and atraumatic.  Right Ear: External ear normal.  Left Ear: External ear normal.  Mouth/Throat: Oropharynx is clear and moist. No oropharyngeal exudate.  Eyes:  Conjunctivae and EOM are normal. Pupils are equal, round, and reactive to light. Right eye exhibits no discharge. Left eye exhibits no discharge. No scleral icterus.  Neck: Normal range of motion. Neck supple. No JVD present. No tracheal deviation present. No thyromegaly present.  Cardiovascular: Normal rate, regular rhythm, normal heart sounds and intact distal pulses.  Exam reveals no gallop and no friction rub.   No murmur heard. Pulmonary/Chest: Effort normal. No respiratory distress. She has no wheezes. She has rales. She exhibits no tenderness.  Abdominal: Soft. Bowel sounds are normal. She exhibits no distension and no mass. There is no tenderness. There is no rebound and no guarding.  Musculoskeletal: Normal range of motion. She exhibits no edema and no tenderness.       Uses walker kyphotic  Lymphadenopathy:    She has no cervical adenopathy.  Neurological: She is alert and oriented to person, place, and time. She has normal reflexes. No cranial nerve deficit. She exhibits normal muscle tone. Coordination normal.  Skin: Skin is warm and dry. No rash noted. She is not diaphoretic. No erythema. No pallor.  Psychiatric: She has a normal mood and affect. Her behavior is normal. Judgment and thought content normal.       Somewhat flaat affect          Assessment & Plan:

## 2010-04-17 NOTE — Assessment & Plan Note (Addendum)
We discussed extensively about  Sjogrens (all features fit with this). Explained that ILD is already progressive and likely to continue. Steroid side effects of osteoprosis, diabetes, bp, cataracts, bruising, easy infection, weight gain, adrenal suppression all explained. She understands risks of Rx and not treatment. She has deliberated this and in balance I have recommended and she agrees a short 2-3 month prednisone trial starting at lower dose of 30mg  per day. IF she handles this at 2-3 weeks, we can start steroid sparing immuran. I have d/w her rheumatologist Dr. Gavin Potters who has kindly agreed to initiate and monitor immuran due to logistics.   We discussed some future issues. If she declines, we can arrange for home hospice or home health or nursing home transfer. For now she feels she is able to manage at home  Of note, pseudomonas in BAL is probably a contaminant. To be on safe side will give course of cipro  Spent 30 minutes of this visit in face to face counselling

## 2010-04-18 NOTE — Progress Notes (Signed)
Summary: fu needed  Phone Note Outgoing Call   Summary of Call: bronch completed today. please get her back after 3/22 but before completion of 7-10 days from today. for fu and discussion of results or Initial call taken by: Kalman Shan MD,  April 05, 2010 1:12 PM  Follow-up for Phone Call        pt set to see MR on 04-16-10 at 3:00. Carron Curie CMA  April 09, 2010 6:13 PM

## 2010-04-18 NOTE — Consult Note (Signed)
Summary: [facility]   Imported By: Sherian Rein 04/08/2010 07:23:42  _____________________________________________________________________  External Attachment:    Type:   Image     Comment:   External Document

## 2010-05-01 ENCOUNTER — Telehealth: Payer: Self-pay | Admitting: Internal Medicine

## 2010-05-01 DIAGNOSIS — J849 Interstitial pulmonary disease, unspecified: Secondary | ICD-10-CM

## 2010-05-01 DIAGNOSIS — M3500 Sicca syndrome, unspecified: Secondary | ICD-10-CM

## 2010-05-01 MED ORDER — PREDNISONE 10 MG PO TABS: ORAL_TABLET | ORAL | Status: DC

## 2010-05-01 NOTE — Telephone Encounter (Signed)
Would have her decrease her prednisone to 20mg  a day for now and see if she tolerates better.  Please send a copy of this to Dr. Marchelle Gearing so he is aware of what is going on .

## 2010-05-01 NOTE — Telephone Encounter (Signed)
Called and spoke w/ pt and she states she is currently on prednisone 30 mg a day. Pt states she she has started taking this dose of medication (04/16/10) she has been feeling weak, feels depressed, and has been losing weight. Pt states she just feels terrible. Pt is wanting to know if she can cut down her prednisone. Pt was advised by MR to call if she was having any side effects. Since MR is not in the office will forward to doc of the day. Dr. Shelle Iron. Please advise. Thanks  Carver Fila, CMA

## 2010-05-01 NOTE — Telephone Encounter (Signed)
Spoke w/ pt and advised her to decreased prednisone to 20 mg a day and see if she tolerates that better. I advised pt to call back if she is still having side effects. Pt advised me that she would. Will forward to MR so he is aware. Please advise. Thanks  Carver Fila, CMA

## 2010-05-02 NOTE — Telephone Encounter (Signed)
Ok to cut prednisone down to 20mg  per day. Have her call back if this is also too much for her

## 2010-05-02 NOTE — Telephone Encounter (Signed)
Pt aware.

## 2010-05-03 ENCOUNTER — Telehealth: Payer: Self-pay | Admitting: Internal Medicine

## 2010-05-03 LAB — FUNGUS CULTURE W SMEAR: Fungal Smear: NONE SEEN

## 2010-05-03 NOTE — Telephone Encounter (Signed)
error 

## 2010-05-07 ENCOUNTER — Encounter: Payer: Self-pay | Admitting: Internal Medicine

## 2010-05-09 ENCOUNTER — Ambulatory Visit (INDEPENDENT_AMBULATORY_CARE_PROVIDER_SITE_OTHER): Payer: Medicare Other | Admitting: Internal Medicine

## 2010-05-09 ENCOUNTER — Encounter: Payer: Self-pay | Admitting: Internal Medicine

## 2010-05-09 DIAGNOSIS — R0609 Other forms of dyspnea: Secondary | ICD-10-CM

## 2010-05-09 DIAGNOSIS — J8410 Pulmonary fibrosis, unspecified: Secondary | ICD-10-CM

## 2010-05-09 DIAGNOSIS — R0600 Dyspnea, unspecified: Secondary | ICD-10-CM

## 2010-05-09 DIAGNOSIS — Z5181 Encounter for therapeutic drug level monitoring: Secondary | ICD-10-CM

## 2010-05-09 DIAGNOSIS — J84115 Respiratory bronchiolitis interstitial lung disease: Secondary | ICD-10-CM

## 2010-05-09 DIAGNOSIS — M3500 Sicca syndrome, unspecified: Secondary | ICD-10-CM

## 2010-05-09 DIAGNOSIS — J849 Interstitial pulmonary disease, unspecified: Secondary | ICD-10-CM

## 2010-05-09 MED ORDER — PREDNISONE 10 MG PO TABS: 10.0000 mg | ORAL_TABLET | Freq: Every day | ORAL | Status: DC

## 2010-05-09 NOTE — Patient Instructions (Signed)
Lungs are better You should still wear oxygen at night till we get overnight oxygen study but no need at day time Cut prednisone dose down and take 10mg  tablett once daily instead REturn in 3-4 weeks; will do simple walk test again for oxygen Hope crying spells get better

## 2010-05-09 NOTE — Assessment & Plan Note (Addendum)
3 weeks fu after starting prednisone 30mg  per day. She developed depressive symptoms and she is crying. This is improved after we cut prednisone over phone to 20mg  per day but still feels depressed. However, it appears dyspnea is improved. is or room air without desaturation. Simple walk test showed  Improvement; walked 185 feet x 2 laps without dessaturation . Given steroid side effects, will cut prednisone down to 10mg  per day. Willl see her again in 4 weeks  Note;Hoarse voice. No thrush. Will dc advair

## 2010-05-09 NOTE — Assessment & Plan Note (Signed)
cyring spells with prednisone since march 2012  Plan decreasee prednisone to 10mg  per day

## 2010-05-09 NOTE — Progress Notes (Signed)
Subjective:    Patient ID: [name], female    DOB: 1923-07-08, 77 y.o.   MRN: [id number]  HPI    Review of Systems     Objective:   Physical Exam        Assessment & Plan:   Subjective:    Patient ID: [name], female    DOB: 1923-02-01, 77 y.o.   MRN: [id number]  HPI Amost non-smoker.  Idiopathich Cirrhosis since 2007 followed at [facility]. Chronic back issues. Uses walker since  s/p kyphoplasty in June and August 2011. ILD due to Sjogren - new diagnosis Feb 2012. S/p 30mg  prednisone start 04/16/2010  Today 4/192012 visit: 3 weeks fu after starting prednisone 30mg  per day. She developed depressive symptoms and she is crying. This is improved after we cut prednisone over phone to 20mg  per day but still feels depressed. However, it appears dyspnea is improved. is or room air without desaturation. Simple walk test showed  She walked 185 feet x 2 laps without desaturation (double improvement from prior to steroids)  Past Medical History  Diagnosis Date  . Osteoporosis     iv reclast once a year  . Cirrhosis     Dr Mills Koller at [facility]  . Hypertension   . Kyphosis     with back issues s/p kyphoplasty x 4  . Cataract 1998  . Hx of cholecystectomy 2009  . Pulmonary fibrosis   . Sjogren's syndrome Diagnosed Feb 2012     Family History  Problem Relation Age of Onset  . Diabetes Mother   . Diabetes Father   . Diabetes Sister      History   Social History  . Marital Status: Widowed    Spouse Name: N/A    Number of Children: N/A  . Years of Education: N/A   Occupational History  . retired Charity fundraiser    Social History Main Topics  . Smoking status: Former Smoker -- 0.1 packs/day for 4 years    Types: Cigarettes    Quit date: 01/21/1947  . Smokeless tobacco: Not on file  . Alcohol Use: No  . Drug Use: No  . Sexually Active: Not on file   Other Topics Concern  . Not on file   Social History Narrative  . No narrative on file     No Known  Allergies   Outpatient Prescriptions Prior to Visit  Medication Sig Dispense Refill  . amLODipine (NORVASC) 2.5 MG tablet Take 2.5 mg by mouth daily.        . calcium-vitamin D (OSCAL WITH D) 250-125 MG-UNIT per tablet Take 1 tablet by mouth 2 (two) times daily.        . Cyanocobalamin (VITAMIN B-12 IJ) Inject as directed. Once a month       . fluticasone (FLONASE) 50 MCG/ACT nasal spray 2 sprays by Nasal route as needed.        . Fluticasone-Salmeterol (ADVAIR DISKUS) 100-50 MCG/DOSE AEPB Inhale 1 puff into the lungs every 12 (twelve) hours.        Marland Kitchen HYDROcodone-acetaminophen (VICODIN) 5-500 MG per tablet Take 1-2 tablets by mouth as needed. For pain       . Hypromellose ([employer] GENTLE LUBRICANT EYE DROPS) 0.3 % SOLN Apply to eye as needed.        . Multiple Vitamins-Minerals (PRESERVISION/LUTEIN) CAPS Take 1 capsule by mouth 2 (two) times daily after a meal.        . naproxen sodium (ANAPROX) 220 MG tablet  Take 220 mg by mouth as needed.        . triamterene-hydrochlorothiazide (MAXZIDE-25) 37.5-25 MG per tablet Take by mouth daily. 1/2 tablet daily as needed for ankle swelling             Review of Systems  Constitutional: Negative.   HENT: Negative.   Eyes:       Dry eyes  Respiratory: Positive for cough, Dyspnea and wheeze are improved Cardiovascular: Negative.   Gastrointestinal: Negative.   Musculoskeletal: Positive for back pain and gait problem.       Uses walker. S/p kyphoplasty x 4-5  Skin: Negative.   Neurological: Negative.   Hematological: Negative.   Psychiatric/Behavioral:CYRING SPELLS ON PREDNISONE +      Objective:   Physical Exam  Nursing note and vitals reviewed. Constitutional: She is oriented to person, place, and time. She appears well-developed and well-nourished. No distress.  HENT:  Head: Normocephalic and atraumatic.  Right Ear: External ear normal.  Left Ear: External ear normal.  Mouth/Throat: Oropharynx is clear and moist. No oropharyngeal  exudate.  Eyes: Conjunctivae and EOM are normal. Pupils are equal, round, and reactive to light. Right eye exhibits no discharge. Left eye exhibits no discharge. No scleral icterus.  Neck: Normal range of motion. Neck supple. No JVD present. No tracheal deviation present. No thyromegaly present.  Cardiovascular: Normal rate, regular rhythm, normal heart sounds and intact distal pulses.  Exam reveals no gallop and no friction rub.   No murmur heard. Pulmonary/Chest: Effort normal. No respiratory distress. She has no wheezes. She has rales. She exhibits no tenderness.  Abdominal: Soft. Bowel sounds are normal. She exhibits no distension and no mass. There is no tenderness. There is no rebound and no guarding.  Musculoskeletal: Normal range of motion. She exhibits no edema and no tenderness.       Uses walker kyphotic  Lymphadenopathy:    She has no cervical adenopathy.  Neurological: She is alert and oriented to person, place, and time. She has normal reflexes. No cranial nerve deficit. She exhibits normal muscle tone. Coordination normal.  Skin: Skin is warm and dry. No rash noted. She is not diaphoretic. No erythema. No pallor.  Psychiatric: She has a normal mood and affect. Her behavior is normal. Judgment and thought content normal.       Somewhat flaat affect          Assessment & Plan:

## 2010-05-10 ENCOUNTER — Encounter: Payer: Self-pay | Admitting: Internal Medicine

## 2010-05-18 LAB — AFB CULTURE WITH SMEAR (NOT AT ARMC): Acid Fast Smear: NONE SEEN

## 2010-05-30 ENCOUNTER — Ambulatory Visit (INDEPENDENT_AMBULATORY_CARE_PROVIDER_SITE_OTHER): Payer: Medicare Other | Admitting: Internal Medicine

## 2010-05-30 ENCOUNTER — Encounter: Payer: Self-pay | Admitting: Internal Medicine

## 2010-05-30 VITALS — BP 124/84 | HR 82 | Temp 98.3°F | Ht 65.5 in | Wt 149.4 lb

## 2010-05-30 DIAGNOSIS — J8410 Pulmonary fibrosis, unspecified: Secondary | ICD-10-CM

## 2010-05-30 DIAGNOSIS — M3500 Sicca syndrome, unspecified: Secondary | ICD-10-CM

## 2010-05-30 DIAGNOSIS — J84115 Respiratory bronchiolitis interstitial lung disease: Secondary | ICD-10-CM

## 2010-05-30 DIAGNOSIS — J849 Interstitial pulmonary disease, unspecified: Secondary | ICD-10-CM

## 2010-05-30 MED ORDER — PREDNISONE 5 MG PO TABS: 5.0000 mg | ORAL_TABLET | Freq: Every day | ORAL | Status: DC

## 2010-05-30 NOTE — Progress Notes (Signed)
Subjective:    Patient ID: [name], female    DOB: [birth year], 77 y.o.   MRN: [id number]  HPIHPI Amost non-smoker. Idiopathich Cirrhosis since 2007 followed at [facility]. Chronic back issues. Uses walker since s/p kyphoplasty in June and August 2011. ILD due to Sjogren - new diagnosis Feb 2012. S/p 30mg  prednisone start 04/16/2010    OV 4/098119 visit: 3 weeks fu after starting prednisone 30mg  per day. She developed depressive symptoms and she is crying. This is improved after we cut prednisone over phone to 20mg  per day but still feels depressed. However, it appears dyspnea is improved. is or room air without desaturation. Simple walk test showed She walked 185 feet x 2 laps without desaturation (double improvement from prior to steroids). REC:  Lungs are better . You should still wear oxygen at night till we get overnight oxygen study but no need at day time . Cut prednisone dose down and take 10mg  tablett once daily instead. REturn in 3-4 weeks; will do simple walk test again for oxygen. Hope crying spells get better  OV 05/30/2010: 6 weeks and 2 days since starting prednsione for Sjogren's related  ILD. Presents with niece as usual. States prednisone not working for her. Crying spells significantly better and almost resolved at current 10mg  per day dose but feels prednisone is making her tired, loss of appetite, pedal edema. She wants to stop it but wants direction from me as well. She feels lungs are no better though she was able to walk 3 laps in office without desaturation - lowest pulse ox was 89%. The exertional distance has improved and she is not desaturating       Review of Systems  Constitutional: Positive for appetite change and unexpected weight change. Negative for fever.  HENT: Positive for trouble swallowing and postnasal drip. Negative for ear pain, nosebleeds, congestion, sore throat, rhinorrhea, sneezing, dental problem and sinus pressure.   Eyes: Positive for  redness and itching.  Respiratory: Positive for shortness of breath and wheezing. Negative for cough and chest tightness.   Cardiovascular: Positive for leg swelling. Negative for palpitations.  Gastrointestinal: Negative for nausea and vomiting.  Genitourinary: Negative for dysuria.  Musculoskeletal: Negative for joint swelling.  Skin: Negative for rash.  Neurological: Negative for headaches.  Hematological: Bruises/bleeds easily.  Psychiatric/Behavioral: Negative for dysphoric mood. The patient is nervous/anxious.        Objective:   Physical Exam Nursing note and vitals reviewed.  Constitutional: She is oriented to person, place, and time. She appears well-developed and well-nourished. No distress.  HENT:  Head: Normocephalic and atraumatic.  Right Ear: External ear normal.  Left Ear: External ear normal.  Mouth/Throat: Oropharynx is clear and moist. No oropharyngeal exudate.  Eyes: Conjunctivae and EOM are normal. Pupils are equal, round, and reactive to light. Right eye exhibits no discharge. Left eye exhibits no discharge. No scleral icterus.  Neck: Normal range of motion. Neck supple. No JVD present. No tracheal deviation present. No thyromegaly present.  Cardiovascular: Normal rate, regular rhythm, normal heart sounds and intact distal pulses. Exam reveals no gallop and no friction rub.  No murmur heard.  Pulmonary/Chest: Effort normal. No respiratory distress. She has no wheezes. She has rales. She exhibits no tenderness.  Abdominal: Soft. Bowel sounds are normal. She exhibits no distension and no mass. There is no tenderness. There is no rebound and no guarding.  Musculoskeletal: Normal range of motion. She exhibits no edema and no tenderness.  Uses walker kyphotic  Lymphadenopathy:  She has no cervical adenopathy.  Neurological: She is alert and oriented to person, place, and time. She has normal reflexes. No cranial nerve deficit. She exhibits normal muscle tone.  Coordination normal.  Skin: Skin is warm and dry. No rash noted. She is not diaphoretic. No erythema. No pallor.  Psychiatric: She has a normal mood and affect. Her behavior is normal. Judgment and thought content normal.  Somewhat flaat affect        Assessment & Plan:

## 2010-05-30 NOTE — Assessment & Plan Note (Signed)
Clinically better though rt base crack;les still present. Walking distance has improved. She wants to come off prednisone due to side effects. We discussed. Niece also feels to cut down prednisone to 5mg  per day and reassess in 3-4 weeks with CT and then decide side effect v benefit profile. She is not interested in immuran Rx. I will cut pred to 5mg  per day and reassess in 4 weeks. She should call if any problems in between

## 2010-05-30 NOTE — Patient Instructions (Signed)
Please cut prednisone down to 5mg  per day; you can cut your pills in half for this I have also sent prescription prednisone 5mg  tablets which you can use after your current bottle runs out REturn to see me in 4 weeks In between any problems call me Have CT chest on return

## 2010-06-07 ENCOUNTER — Telehealth: Payer: Self-pay | Admitting: Internal Medicine

## 2010-06-07 NOTE — Telephone Encounter (Signed)
Called and left msg for [facility], that we do not have a cmn on this patient, all Dr Darnelle Catalan cmns are signed and he is out of the office until next week, she will need to refax .Kandice Hams

## 2010-06-07 NOTE — Op Note (Signed)
[name], [name]                ACCOUNT NO.:  [id number]   MEDICAL RECORD NO.:  [id number]          PATIENT TYPE:  INP   LOCATION:  3008                         FACILITY:  [facility]   PHYSICIAN:  Tia Alert, MD     DATE OF BIRTH:  15-Jan-1924   DATE OF PROCEDURE:  12/05/2005  DATE OF DISCHARGE:                               OPERATIVE REPORT   PREOPERATIVE DIAGNOSIS:  Lumbar spinal stenosis L2-3, with back and leg  pain with leg weakness.   POSTOPERATIVE DIAGNOSIS:  Lumbar spinal stenosis L2-3, with back and leg  pain with leg weakness.   PROCEDURE:  Decompressive lumbar hemilaminectomy, medial facetectomy,  and foraminotomy, L2-3 on the left, followed by sublaminar decompression  L2-3 for central and central canal right lateral recess decompression.   SURGEON:  Tia Alert, M.D.   ASSISTANT:  Reinaldo Meeker, M.D.   ANESTHESIA:  General endotracheal.   COMPLICATIONS:  None apparent.   INDICATIONS FOR THE PROCEDURE:  Ms. Tippins is an 77 year old female who  was referred with back pain with leg weakness.  She was found to have  spinal stenosis on both MRI and CT myelography. She underwent physical  therapy and had improvement in her leg weakness, but continued to have  symptoms consistent with claudication.  I recommended lumbar  decompressive laminectomy at L2-3.  She understood the risks, benefits,  and expected outcome, and wished to proceed.   DESCRIPTION OF THE PROCEDURE:  The patient was taken to the operating  room, and after induction of adequate general endotracheal anesthesia,  she was rolled into the prone position on the Wilson frame, and all  pressure points were padded.  Her lumbar region was prepped with  DuraPrep and then draped in the usual sterile fashion.  5 cc of local  anesthesia was injected,  and then a small dorsal midline incision was  made and carried down to the lumbosacral fascia.  The fascia was opened  on the patient's left side and taken  down in a periosteal fashion to  expose L2-3 on the left.  Intraoperative x-ray confirmed my level at L2-  3, and then the combination of the Kerrison punch and the high-speed  drill was used to perform a hemilaminectomy, medial facetectomy, and  foraminotomy at L2-3 on the left. The underlying yellow ligament was  opened and removed in a piecemeal fashion.  She had quite a bit of  overgrown ligament, especially in the lateral recess at L2-3 on the  left.  This was removed, and then I undercut the facet to decompress the  lateral recess.  I then identified the disc space.  There seemed to be  some spondylosis here, but no obvious significant disc herniation.  I  dissected out to the medial pedicle wall and performed a significant  foraminotomy.  I then used the drill to drill up under the spinous  process and lamina at L2-3 and then used the Kerrison punch to undercut  the lamina at L2-3, removing significant amounts of yellow ligament from  the right lateral recess until I could identify the  disc space on the  right, and also identified the pedicle on the right and the L3 nerve  root on the right.  These were well decompressed.  I then irrigated with  saline solution containing bacitracin.  I used a nerve hook to palpate  into the foramina once again to ensure adequate decompression, and then  lined the dura with Gelfoam after irrigating with copious amounts of  bacitracin-containing saline solution.  I then closed the fascia with  interrupted #1 Vicryl.  I closed subcutaneous and subcuticular tissues  with 2-0 and 3-0 Vicryl,  and closed skin with Benzoin and Steri-Strips.  The drapes were removed.  A sterile dressing was applied.  The patient was awakened from general  anesthesia and transported to the recovery room in stable condition.  At  the end of the procedure, all sponge, needle, and instrument counts were  correct.      Tia Alert, MD  Electronically Signed      DSJ/MEDQ  D:  12/05/2005  T:  12/05/2005  Job:  [id number]

## 2010-06-07 NOTE — Telephone Encounter (Signed)
Please advise Alida if you have CMN on pt. Thanks  Carver Fila, CMA

## 2010-06-17 ENCOUNTER — Telehealth: Payer: Self-pay | Admitting: Internal Medicine

## 2010-06-17 DIAGNOSIS — J8410 Pulmonary fibrosis, unspecified: Secondary | ICD-10-CM

## 2010-06-17 NOTE — Telephone Encounter (Signed)
[name],  Reviewed overnight oxygen reslts, only  8seconds below pulse ox 85%. Avg pulse ox wwas 84%, Lowest was 84% and this was 22:50 -> 5:22am. Then after that till 7am she went low to 69% but only for 12 sconds. Based on this,  Overall she can take o2 at night but if she does not want she can dc it

## 2010-06-21 NOTE — Telephone Encounter (Signed)
Spoke with pt and she states she would rather not use oxygen, so order sent to have this d/c. Carron Curie, CMA

## 2010-06-25 ENCOUNTER — Ambulatory Visit (INDEPENDENT_AMBULATORY_CARE_PROVIDER_SITE_OTHER): Payer: Medicare Other | Admitting: Internal Medicine

## 2010-06-25 ENCOUNTER — Ambulatory Visit (INDEPENDENT_AMBULATORY_CARE_PROVIDER_SITE_OTHER)
Admission: RE | Admit: 2010-06-25 | Discharge: 2010-06-25 | Disposition: A | Discharge status: Home / Self Care | Payer: Medicare Other | Source: Ambulatory Visit | Attending: Internal Medicine | Admitting: Internal Medicine

## 2010-06-25 ENCOUNTER — Encounter: Payer: Self-pay | Admitting: Internal Medicine

## 2010-06-25 VITALS — BP 130/72 | HR 72 | Temp 98.0°F | Ht 65.5 in | Wt 142.4 lb

## 2010-06-25 DIAGNOSIS — M3500 Sicca syndrome, unspecified: Secondary | ICD-10-CM

## 2010-06-25 DIAGNOSIS — J84115 Respiratory bronchiolitis interstitial lung disease: Secondary | ICD-10-CM

## 2010-06-25 DIAGNOSIS — J8410 Pulmonary fibrosis, unspecified: Secondary | ICD-10-CM

## 2010-06-25 DIAGNOSIS — J849 Interstitial pulmonary disease, unspecified: Secondary | ICD-10-CM

## 2010-06-25 NOTE — Patient Instructions (Signed)
CT scan some better Your overnight oxygen on 06/07/2010 was normal My nurse will do order to stop your oxygen I am going to talk to Dr Gavin Potters, your primary doctor and liver doctor about  - your fatigue and its causes  - if okay to try mycophenalate mofetil (generic for cellcept) medication instead of steroids - if you qualify for some home health services My nurse will find out cost of mycophenalate mofetil from your pharmacy for you at 500mg  twice daily strength I will call you after above - give me 1 week or so

## 2010-06-25 NOTE — Progress Notes (Signed)
Subjective:    Patient ID: [name], female    DOB: Aug 11, 1923, 77 y.o.   MRN: [id number]  HPI  HPIHPI Amost non-smoker. Idiopathich Cirrhosis since 2007 followed at [facility]. Chronic back issues. Uses walker since s/p kyphoplasty in June and August 2011. ILD due to Sjogren - new diagnosis Feb 2012. S/p 30mg  prednisone start 04/16/2010  OV 4/192012 visit: 3 weeks fu after starting prednisone 30mg  per day. She developed depressive symptoms and she is crying. This is improved after we cut prednisone over phone to 20mg  per day but still feels depressed. However, it appears dyspnea is improved. is or room air without desaturation. Simple walk test showed She walked 185 feet x 2 laps without desaturation (double improvement from prior to steroids). REC: Lungs are better . You should still wear oxygen at night till we get overnight oxygen study but no need at day time . Cut prednisone dose down and take 10mg  tablett once daily instead. REturn in 3-4 weeks; will do simple walk test again for oxygen. Hope crying spells get better    OV 05/30/2010: 6 weeks and 2 days since starting prednsione for Sjogren's related ILD. Presents with niece as usual. States prednisone not working for her. Crying spells significantly better and almost resolved at current 10mg  per day dose but feels prednisone is making her tired, loss of appetite, pedal edema. She wants to stop it but wants direction from me as well. She feels lungs are no better though she was able to walk 3 laps in office without desaturation - lowest pulse ox was 89%. The exertional distance has improved and she is not desaturating   OV 06/25/2010: Followup Sjogren related ILD. Overnight oxygen study shows no desaturation. CT chest to me looks some beter althouth radiologist felt no change. She is tolerateing lower dose of prednosone better but overall feels fatigued. She is not to took keen to continue taking it. She wants quality of life. She is  very despondent. Willing to try cellcept solo wihtout prednisone if okay with her other doctors. Cost she says will be an issue.  Risks of cancer, infection, gi side effects warned. Note: as of 07/28/2010: I have not been able to reach her hepatologist despite leaving numerous messages. Dr Jerline Pain (I spoke to him) is okay with cell cept and is agreeable to do monitoring.     Review of Systems  Constitutional: Negative for fever and unexpected weight change.  HENT: Negative for ear pain, nosebleeds, congestion, sore throat, rhinorrhea, sneezing, trouble swallowing, dental problem, postnasal drip and sinus pressure.   Eyes: Negative for redness and itching.  Respiratory: Positive for shortness of breath. Negative for cough, chest tightness and wheezing.   Cardiovascular: Negative for palpitations and leg swelling.  Gastrointestinal: Negative for nausea and vomiting.  Genitourinary: Negative for dysuria.  Musculoskeletal: Negative for joint swelling.  Skin: Negative for rash.  Neurological: Negative for headaches.  Hematological: Does not bruise/bleed easily.  Psychiatric/Behavioral: Negative for dysphoric mood. The patient is not nervous/anxious.   LOSING WEIGHT + CONSTANT FATIGUE + DOES NOT WANT TO LIVE WITH CURRENT QUALITY OF LIFE + WISHES SHE WAS DEAD + MORE FEAR OF DYING +     Objective:   Physical Exam Physical Exam  Nursing note and vitals reviewed.  Constitutional: She is oriented to person, place, and time. She appears well-developed and well-nourished. No distress.  HENT:  Head: Normocephalic and atraumatic.  Right Ear: External ear normal.  Left Ear: External ear normal.  Mouth/Throat: Oropharynx is clear and moist. No oropharyngeal exudate.  Eyes: Conjunctivae and EOM are normal. Pupils are equal, round, and reactive to light. Right eye exhibits no discharge. Left eye exhibits no discharge. No scleral icterus.  Neck: Normal range of motion. Neck supple. No JVD present. No  tracheal deviation present. No thyromegaly present.  Cardiovascular: Normal rate, regular rhythm, normal heart sounds and intact distal pulses. Exam reveals no gallop and no friction rub.  No murmur heard.  Pulmonary/Chest: Effort normal. No respiratory distress. She has no wheezes. She has rales. She exhibits no tenderness.  Abdominal: Soft. Bowel sounds are normal. She exhibits no distension and no mass. There is no tenderness. There is no rebound and no guarding.  Musculoskeletal: Normal range of motion. She exhibits no edema and no tenderness.  Uses walker kyphotic  Lymphadenopathy:  She has no cervical adenopathy.  Neurological: She is alert and oriented to person, place, and time. She has normal reflexes. No cranial nerve deficit. She exhibits normal muscle tone. Coordination normal.  Skin: Skin is warm and dry. No rash noted. She is not diaphoretic. No erythema. No pallor.  Psychiatric: She has a normal mood and affect. Her behavior is normal. Judgment and thought content normal.  Somewhat flaat affect            Assessment & Plan:

## 2010-07-28 ENCOUNTER — Telehealth: Payer: Self-pay | Admitting: Internal Medicine

## 2010-07-28 ENCOUNTER — Encounter: Payer: Self-pay | Admitting: Internal Medicine

## 2010-07-28 NOTE — Assessment & Plan Note (Signed)
CT scan some better Your overnight oxygen on 06/07/2010 was normal My nurse will do order to stop your oxygen I am going to talk to Dr Gavin Potters, your primary doctor and liver doctor about  - your fatigue and its causes  - if okay to try mycophenalate mofetil (generic for cellcept) medication instead of steroids - if you qualify for some home health services My nurse will find out cost of mycophenalate mofetil from your pharmacy for you at 500mg  twice daily strength I will call you after above - give me 1 week or so  .Marland KitchenMarland KitchenMarland Kitchen#update: 07/28/2010: unabel to get  Hold of hepatologist. Will try again. If not, will give patient cost of cell cept andstart it at 500mg  once daily

## 2010-07-28 NOTE — Telephone Encounter (Signed)
Jen  A) Please email me the name of the hepatolgost at [facility]. I will try reacing by email  B) please send note to Dr Gavin Potters in [place]  C) could you please get the latest dose of pred from patient ( I forgot what I told her - ? Is she off)  D) could you please find out from her pharmacy (for her insurance) what they would charge for mycophenalate mofetil (Generic) 500mg  bid for 30 day supply (if this is something you cannot do let me know)

## 2010-07-30 ENCOUNTER — Telehealth: Payer: Self-pay | Admitting: Internal Medicine

## 2010-07-30 NOTE — Telephone Encounter (Signed)
Spoke with the pt and she states  She is very jittery and is having trouble sleeping and wants to know if she can stop prednisone. She states at last OV MR discussed with her possible coming off of the medication, but she never heard whether she was supposed to come off or not. Pt is requesting to stop prednisone. Please advise.Carron Curie, CMA

## 2010-07-30 NOTE — Telephone Encounter (Signed)
Pt advised. Emalea Mix, CMA  

## 2010-07-30 NOTE — Telephone Encounter (Signed)
Based on my read of last note, MR wasn't going to stop Pred without changing to another immunosuppression agent, was investigating CellCept. I don't think she should stop the pred cold as I am not sure how long she has been on it. There is risk for adrenal suppression.

## 2010-08-02 MED ORDER — PREDNISONE 1 MG PO TABS: ORAL_TABLET | ORAL | Status: DC

## 2010-08-02 NOTE — Telephone Encounter (Signed)
A) I emailed MR the name of [facility], which is Dr. Cornelia Copa.   B) Note faxed to Dr. Gavin Potters  C) Pt is currenlty on 5 mg prednisone and feeling very jittery and is asking to come off of medication. I emailed MR about this and he recs the following: Cut to 2mg  daily x 2 weeks, ten 1 mg daily x 2 weeks, then 1 mg every other day x 2 weeks then stop. Pt is aware of these recs. Rx has been sent.   D) I called the pharmacy and they cash price for the cellcept was $420 a month. They states they cannot do a quote with insurance without an RX. Per MR call in RX for 500mg  once daily. I advised the pt of this and she request to not take this medication at this time. She wants to see how she feels off of the prednisone and then will decide. Carron Curie, CMA

## 2010-08-23 ENCOUNTER — Other Ambulatory Visit: Payer: Self-pay | Admitting: Internal Medicine

## 2010-08-26 ENCOUNTER — Telehealth: Payer: Self-pay | Admitting: Internal Medicine

## 2010-08-26 MED ORDER — PREDNISONE 1 MG PO TABS: ORAL_TABLET | ORAL | Status: DC

## 2010-08-26 NOTE — Telephone Encounter (Signed)
Refill sent. Pt aware.Jennifer Castillo, CMA  

## 2010-10-03 ENCOUNTER — Telehealth: Payer: Self-pay | Admitting: Internal Medicine

## 2010-10-03 NOTE — Telephone Encounter (Signed)
Forwarded to Dr. Ramaswamy for review.  °

## 2010-11-16 IMAGING — NM NUCLEAR MEDICINE WHOLE BODY BONE SCINTIGRAPHY
4 series · 20 of 20 positions shown · non-contrast
Comparison: none

REASON FOR EXAM: BACK PAIN AND CIRRHOSIS OF LIVER
COMMENTS:

[Series 1000: statics (reformatted series) · 2.40mm/px · 4 acquisitions, 8 frames shown]
[im 1/4]
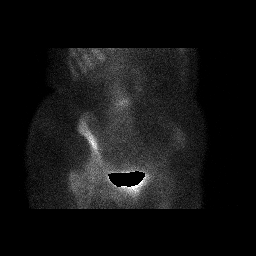
[im 1/4]
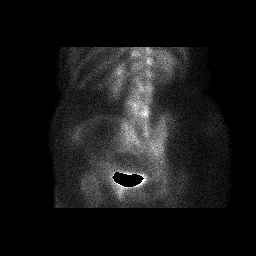
[im 2/4]
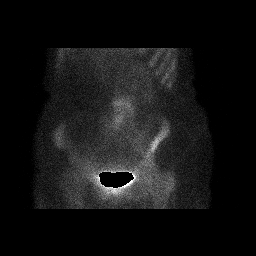
[im 2/4]
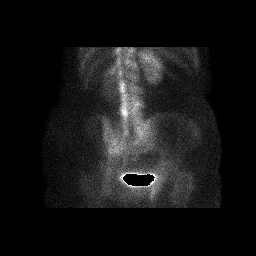
[im 3/4]
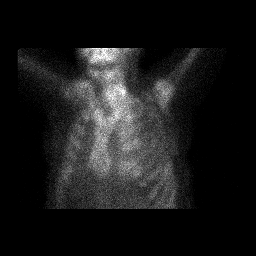
[im 3/4]
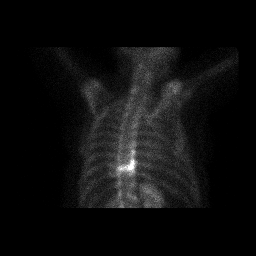
[im 4/4]
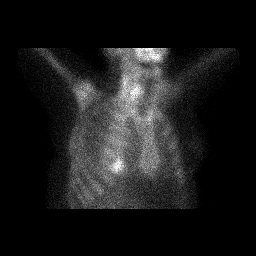
[im 4/4]
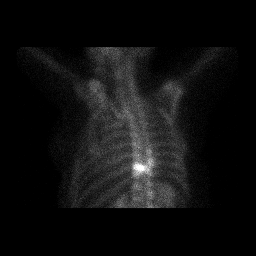

[Series 1000: statics · 2.40mm/px · 4 acquisitions, 8 frames shown]
[im 1/4]
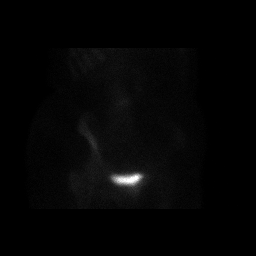
[im 1/4]
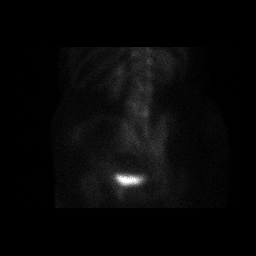
[im 2/4]
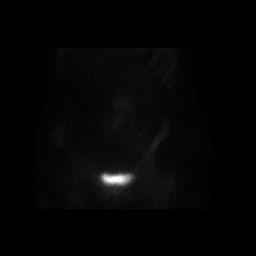
[im 2/4]
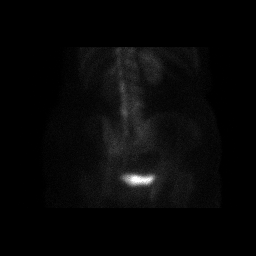
[im 3/4]
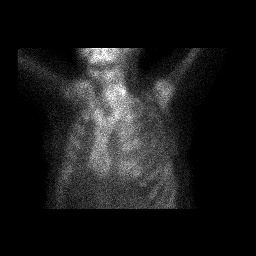
[im 3/4]
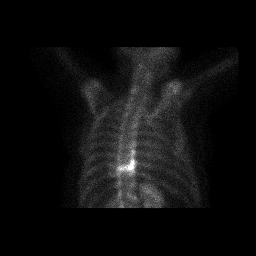
[im 4/4]
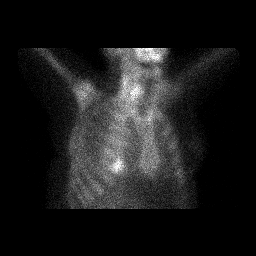
[im 4/4]
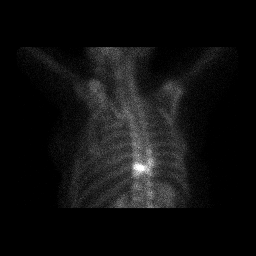

[Series 1000: 3 hr wholebody (reformatted series) · 2.40mm/px · 2 of 2 frames shown]
[frame 1/2]
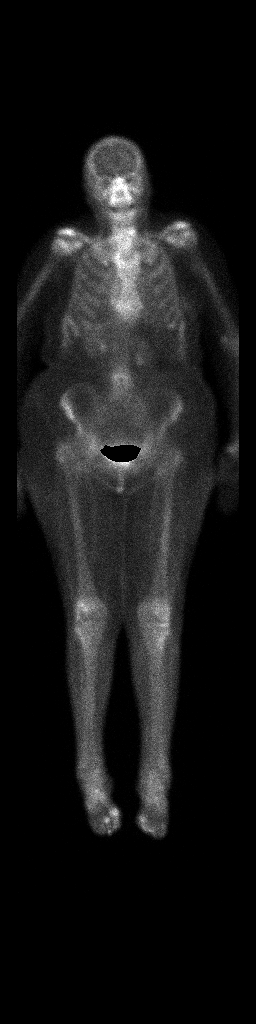
[frame 2/2]
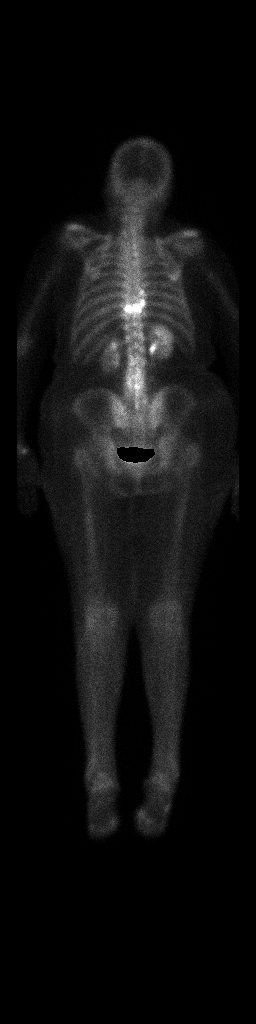

[Series 1000: 3 hr wholebody · 2.40mm/px · 2 of 2 frames shown]
[frame 1/2]
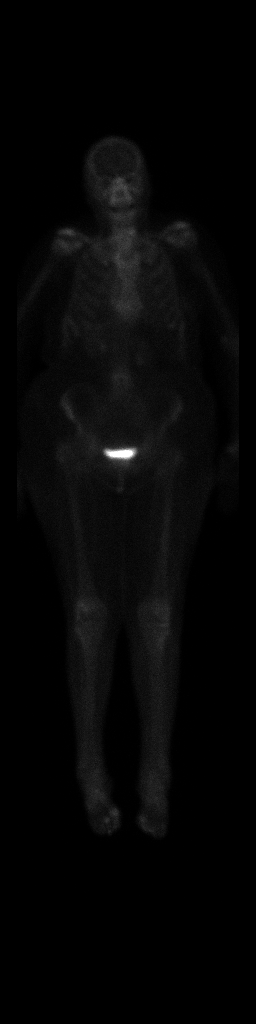
[frame 2/2]
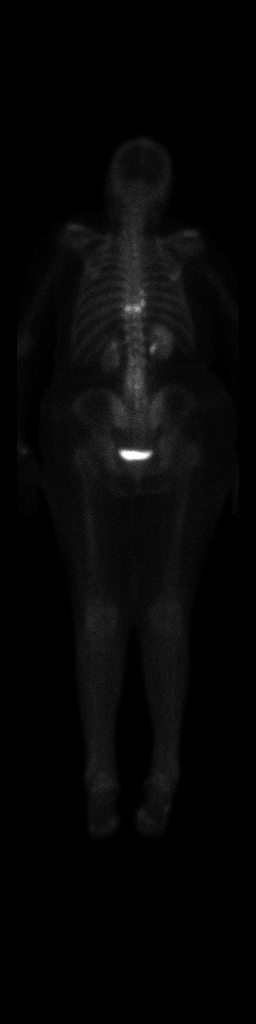

[20 of 20 positions shown; findings below may reference images not displayed]

PROCEDURE:     NM  - NM BONE WB 3 HR [DATE]  [DATE]

RESULT:     Following intravenous administration of 23.44 mCi technetium 99m
MDP, total body bone scan was performed. There is noted a dense abnormal
increase in tracer activity in the thoracic spine at approximately T9 or
T10. This was not present on the prior bone scan of 11/13/2005. The finding
is nonspecific and could be secondary to fracture, focal arthritic change,
infection or neoplasm. Correlation with thoracic spine radiographs is
recommended. There are also noted tiny focal areas of increased tracer
activity at the spinal attachment of approximately the eighth and ninth
right posterior ribs. The linear appearance would favor posttraumatic
change. This, too, could be further evaluated by thoracic spine radiographs
or thoracic spine MR if such is clinically desired. There is a very slight
increase in tracer activity in the lower lumbar spine, most compatible with
degenerative change. The distribution of tracer activity in the skeletal
system otherwise is normal. Tracer activity is seen in both kidneys.
IMPRESSION: 1. There is a dense abnormal area of increased tracer activity at
approximately T9 or T10. The finding is nonspecific and the differential is
noted above.
2. There is increased tracer activity at the spinal attachment of
approximately the eighth and ninth right posterior ribs. As mentioned above
the linear distribution would favor posttraumatic change.
3. Further evaluation by correlation with thoracic spine radiographs is
recommended.

## 2010-12-26 ENCOUNTER — Telehealth: Payer: Self-pay | Admitting: Internal Medicine

## 2010-12-26 ENCOUNTER — Ambulatory Visit (INDEPENDENT_AMBULATORY_CARE_PROVIDER_SITE_OTHER): Payer: Medicare Other | Admitting: Internal Medicine

## 2010-12-26 ENCOUNTER — Encounter: Payer: Self-pay | Admitting: Internal Medicine

## 2010-12-26 VITALS — BP 128/72 | HR 76 | Temp 98.2°F | Ht 65.5 in | Wt 128.8 lb

## 2010-12-26 DIAGNOSIS — J8410 Pulmonary fibrosis, unspecified: Secondary | ICD-10-CM

## 2010-12-26 NOTE — Telephone Encounter (Signed)
Order faxed to [facility]

## 2010-12-26 NOTE — Patient Instructions (Signed)
I think your lung dissease and Sjogren is getting worse At this stage we have agreed to do symptom based treatment approach I think for shortness of breath portable oxygen system will help; let us start that You should also have ONO study so we can set up oxygen at night Continue your dry eye care Will send notes to all your doctors Return in 6-8 weeks for followup. Depending on your course we wil continue to adjust medications for shortness of breath or cough and gettting help at home

## 2010-12-26 NOTE — Assessment & Plan Note (Signed)
Now off steroids since 3 months. With this dry eyes and ILD getting worse. Discussed restart prednisone at low dose. Discussed cellcept as stand alone. Discussed immuran. Based on side effect profile of all those she rejected all those optons. She understands Sjogren and ILD is worsening without immunomodulators. She is very clear that quality of life is important to her and finds meds interfering with it. She is agreeable to o2 restart. We disussed that symptom/palliative approach and she is willing readily for this as her preferred choice. For now will give o2. IF fatigue sets in can consider ritalin or rehab. If dyspnea cough worsen than consier opioids. Hospice not mentioned but in general terms she seemed willing to accept professional help at home to maintain quality if she got worse  > 50% of this > 45 min visit spent in face to face counseling. Visit converted to 45 min visit

## 2010-12-26 NOTE — Progress Notes (Signed)
Subjective:    Patient ID: [name], female    DOB: 1923/08/03, 77 y.o.   MRN: [id number]  HPI most non-smoker. Idiopathich Cirrhosis since 2007 followed at [facility]. Chronic back issues. Uses walker since s/p kyphoplasty in June and August 2011. ILD due to Sjogren - new diagnosis Feb 2012. S/p 30mg  prednisone start 04/16/2010   OV [id number] visit: 3 weeks fu after starting prednisone 30mg  per day. She developed depressive symptoms and she is crying. This is improved after we cut prednisone over phone to 20mg  per day but still feels depressed. However, it appears dyspnea is improved. is or room air without desaturation. Simple walk test showed She walked 185 feet x 2 laps without desaturation (double improvement from prior to steroids). REC: Lungs are better . You should still wear oxygen at night till we get overnight oxygen study but no need at day time . Cut prednisone dose down and take 10mg  tablett once daily instead. REturn in 3-4 weeks; will do simple walk test again for oxygen. Hope crying spells get better    OV 05/30/2010: 6 weeks and 2 days since starting prednsione for Sjogren's related ILD. Presents with niece as usual. States prednisone not working for her. Crying spells significantly better and almost resolved at current 10mg  per day dose but feels prednisone is making her tired, loss of appetite, pedal edema. She wants to stop it but wants direction from me as well. She feels lungs are no better though she was able to walk 3 laps in office without desaturation - lowest pulse ox was 89%. The exertional distance has improved and she is not desaturating   OV 06/25/2010: Followup Sjogren related ILD. Overnight oxygen study shows no desaturation. CT chest to me looks some beter althouth radiologist felt no change. She is tolerateing lower dose of prednosone better but overall feels fatigued. She is not to took keen to continue taking it. She wants quality of life. She is very  despondent. Willing to try cellcept solo wihtout prednisone if okay with her other doctors. Cost she says will be an issue.  Risks of cancer, infection, gi side effects warned. Note: as of 07/28/2010: I have not been able to reach her hepatologist despite leaving numerous messages. Dr Jerline Pain (I spoke to him) is okay with cell cept and is agreeable to do monitoring.   OV 12/26/2010 After last visit; she really wanted to come off prednisone. She had called therefore starting July 10. 2012 we stopped prednisone over 6 weeks. WE investigated cellcept which would be $400 per month out of pocket cost but she wanted to hold off and see how she felt off prednisone before decidding.   Today, she is off prednisone for 3 months. Since stopping prednisone appetite is better and gaining weight (though I notice her to have lost weight since last visit. Her Body mass index is 21.11 kg/(m^2). ).  Steroid side effects are all resolved. She says she feels better now. But feels overall fatigued and is getting worse. In terms of dyspnea this is worse. At rest no dyspnea. But making bed makes her dyspnea. She feels she has worsening dyspnea. She is trying to be more active. Cough has persisted all along but this past week oin cipro for UTI and has improved cough.  Walking her today: walked 90 feet (1/2 lap) and desaturated to 87% and stopped.  Again expresses keen desire for palliation and symptom oriented treatment ("I have lived and am 87 now, good  enough"). RSI cough score is 10. Also sttates, eye drying and blurrded visiion worse after coming off prednisone. Does not want another immunomodulator including stand alone cellcept   Social: PMD Dr Lin Givens is moving out of town. New PMD will be Dr Alonna Buckler in [facility] with [facility]. She is also finding it tough to go to [facility] hepatology. So will change to Dr Lynnae Prude in [place], [place]; also with [facility] [phone]. Has caretaker but currently hse is  off due to pregnancy.   Review of Systems  Constitutional: Negative.  Negative for fever and unexpected weight change.  HENT: Negative.  Negative for ear pain, nosebleeds, congestion, sore throat, rhinorrhea, sneezing, trouble swallowing, dental problem, postnasal drip and sinus pressure.   Eyes: Negative.  Negative for redness and itching.  Respiratory: Positive for cough, shortness of breath and wheezing. Negative for chest tightness.   Cardiovascular: Positive for leg swelling. Negative for palpitations.       Now off amlodipine   Gastrointestinal: Negative.  Negative for nausea and vomiting.  Genitourinary: Negative.  Negative for dysuria.  Musculoskeletal: Negative.  Negative for joint swelling.  Skin: Negative.  Negative for rash.  Neurological: Negative.  Negative for headaches.  Hematological: Negative.  Does not bruise/bleed easily.  Psychiatric/Behavioral: Negative.  Negative for dysphoric mood. The patient is not nervous/anxious.        Objective:   Physical Exam   hysical Exam  Nursing note and vitals reviewed.  Constitutional: She is oriented to person, place, and time. She appears well-developed and well-nourished. No distress.  HENT:  Head: Normocephalic and atraumatic.  Right Ear: External ear normal.  Left Ear: External ear normal.  Mouth/Throat: Oropharynx is clear and moist. No oropharyngeal exudate.  Eyes: Conjunctivae and EOM are normal. Pupils are equal, round, and reactive to light. Right eye exhibits no discharge. Left eye exhibits no discharge. No scleral icterus.  Neck: Normal range of motion. Neck supple. No JVD present. No tracheal deviation present. No thyromegaly present.  Cardiovascular: Normal rate, regular rhythm, normal heart sounds and intact distal pulses. Exam reveals no gallop and no friction rub.  No murmur heard.  Pulmonary/Chest: Effort normal. No respiratory distress. She has no wheezes. She has rales. She exhibits no tenderness.    Abdominal: Soft. Bowel sounds are normal. She exhibits no distension and no mass. There is no tenderness. There is no rebound and no guarding.  Musculoskeletal: Normal range of motion. She exhibits no edema and no tenderness.  Uses walker kyphotic  Lymphadenopathy:  She has no cervical adenopathy.  Neurological: She is alert and oriented to person, place, and time. She has normal reflexes. No cranial nerve deficit. She exhibits normal muscle tone. Coordination normal.  Skin: Skin is warm and dry. No rash noted. She is not diaphoretic. No erythema. No pallor.  Psychiatric: She has a normal mood and affect. Her behavior is normal. Judgment and thought content normal.  Somewhat flaat affect . More cheerful than before  Overall looks like she has lost weight          Assessment & Plan:

## 2010-12-26 NOTE — Telephone Encounter (Signed)
Please send records to all doctors. Read my note there are 2  New doctors now. Send to them and to Dr Gavin Potters and to the eye doctor

## 2010-12-26 NOTE — Telephone Encounter (Signed)
Will forward to [facility] basket

## 2011-01-02 NOTE — Telephone Encounter (Signed)
Notes faxed. Carron Curie, CMA

## 2011-01-05 ENCOUNTER — Telehealth: Payer: Self-pay | Admitting: Internal Medicine

## 2011-01-05 DIAGNOSIS — J8410 Pulmonary fibrosis, unspecified: Secondary | ICD-10-CM

## 2011-01-05 NOTE — Telephone Encounter (Signed)
[name]  Reviewed ono 12/30/10. Mild desaturation - pulse ox 86-89% but the issue is she spent all her sleeping time at that level. So, will benefit from 1L [place] at night. Please set up  FU per prior OV  Thanks MR

## 2011-01-06 ENCOUNTER — Telehealth: Payer: Self-pay | Admitting: Internal Medicine

## 2011-01-06 NOTE — Telephone Encounter (Signed)
See prev phone note. Carron Curie, CMA

## 2011-01-06 NOTE — Telephone Encounter (Signed)
Spoke with the pt and advised of results. Order placed for 1 liter oxygen at bedtime. Carron Curie, CMA

## 2011-01-13 ENCOUNTER — Encounter: Payer: Self-pay | Admitting: Internal Medicine

## 2011-01-20 ENCOUNTER — Telehealth: Payer: Self-pay | Admitting: Internal Medicine

## 2011-01-20 NOTE — Telephone Encounter (Signed)
Pt states ONO was done approx 1 week ago and has not heard back on results. I checked in J[facility]'s paperwork and did not find it. Called [facility] and spoke with [facility] and she advised unable to locate it and will fax once it is located. I called pt back and informed her of above.

## 2011-01-22 NOTE — Telephone Encounter (Signed)
Pt is not needing ONO results those were given on 01-06-11. She states she has not received the oxygen to wear at nighttime, per ONO results MR ordered 1 liter at bedtime. I advised the pt I will call [facility] and she what the issue is. Pt states understanding. Carron Curie, CMA  I spoke with [facility] and they do not have o2 order so I faxed it to them. They will contact the pt. The pt is aware.Carron Curie, CMA

## 2011-01-28 ENCOUNTER — Telehealth: Payer: Self-pay | Admitting: Internal Medicine

## 2011-01-28 NOTE — Telephone Encounter (Signed)
Overnight pulse ox 12/30/10  now shos o2 at night to be below 88% all the time. Please set up home o2 1L [place] with sleep and exertion (portable system for day if she is inclined)  thankss  MR

## 2011-01-30 NOTE — Telephone Encounter (Signed)
This has already been taken care of. The pt is already on 1 liter oxygen at bedtime and portable as well. Carron Curie, CMA

## 2011-02-17 ENCOUNTER — Encounter: Payer: Self-pay | Admitting: Internal Medicine

## 2011-02-17 ENCOUNTER — Ambulatory Visit (INDEPENDENT_AMBULATORY_CARE_PROVIDER_SITE_OTHER): Payer: Medicare Other | Admitting: Internal Medicine

## 2011-02-17 VITALS — BP 116/52 | HR 80 | Temp 97.6°F | Ht 65.0 in | Wt 117.6 lb

## 2011-02-17 DIAGNOSIS — J8410 Pulmonary fibrosis, unspecified: Secondary | ICD-10-CM

## 2011-02-17 MED ORDER — HYDROCODONE-HOMATROPINE 5-1.5 MG/5ML PO SYRP: ORAL_SOLUTION | ORAL | Status: AC

## 2011-02-17 NOTE — Patient Instructions (Addendum)
I am not sure why your oxygen is not set up for use at night - my coordinator will look into it  - use your oxygen with walking and sleep; if needed will set portable system too For cough, use hycodan 5 mL every 4-6 hours as needed (maximum:  30 mL/24 hours)  - if making you too sleepy or constipated let us know For pain, please talk to Dr Gavin Potters Return in 3 months or sooner

## 2011-02-17 NOTE — Assessment & Plan Note (Signed)
-   Jan 2013: progressive weight loss, desaturates at night. Significant cough +. Currently on symptom mgmt plan only. She is not interested in any immunomodulators. She has o2 but it appears it is not set up for night and exertion; we will make sure that happens. For cough, will start hycodan. We discussed end of life issues; she feels she will accept hospice when time is ready which is not now

## 2011-02-17 NOTE — Progress Notes (Signed)
Subjective:    Patient ID: [name], female    DOB: [birth year], 78 y.o.   MRN: [id number]  HPI  Almost non-smoker. Idiopathich Cirrhosis since 2007 followed at [facility]. Chronic back issues. Uses walker since s/p kyphoplasty in June and August 2011. ILD due to Sjogren - new diagnosis Feb 2012. S/p 30mg  prednisone start 04/16/2010   OV [id number] visit: 3 weeks fu after starting prednisone 30mg  per day. She developed depressive symptoms and she is crying. This is improved after we cut prednisone over phone to 20mg  per day but still feels depressed. However, it appears dyspnea is improved. is or room air without desaturation. Simple walk test showed She walked 185 feet x 2 laps without desaturation (double improvement from prior to steroids). REC: Lungs are better . You should still wear oxygen at night till we get overnight oxygen study but no need at day time . Cut prednisone dose down and take 10mg  tablett once daily instead. REturn in 3-4 weeks; will do simple walk test again for oxygen. Hope crying spells get better    OV 05/30/2010: 6 weeks and 2 days since starting prednsione for Sjogren's related ILD. Presents with niece as usual. States prednisone not working for her. Crying spells significantly better and almost resolved at current 10mg  per day dose but feels prednisone is making her tired, loss of appetite, pedal edema. She wants to stop it but wants direction from me as well. She feels lungs are no better though she was able to walk 3 laps in office without desaturation - lowest pulse ox was 89%. The exertional distance has improved and she is not desaturating   OV 06/25/2010: Followup Sjogren related ILD. Overnight oxygen study shows no desaturation. CT chest to me looks some beter althouth radiologist felt no change. She is tolerateing lower dose of prednosone better but overall feels fatigued. She is not to took keen to continue taking it. She wants quality of life. She is very  despondent. Willing to try cellcept solo wihtout prednisone if okay with her other doctors. Cost she says will be an issue.  Risks of cancer, infection, gi side effects warned. Note: as of 07/28/2010: I have not been able to reach her hepatologist despite leaving numerous messages. Dr Jerline Pain (I spoke to him) is okay with cell cept and is agreeable to do monitoring.   OV 12/26/2010 After last visit; she really wanted to come off prednisone. She had called therefore starting July 10. 2012 we stopped prednisone over 6 weeks. WE investigated cellcept which would be $400 per month out of pocket cost but she wanted to hold off and see how she felt off prednisone before decidding.  Today, she is off prednisone for 3 months. Since stopping prednisone appetite is better and gaining weight (though I notice her to have lost weight since last visit. Her Body mass index is 21.11 kg/(m^2). ).  Steroid side effects are all resolved. She says she feels better now. But feels overall fatigued and is getting worse. In terms of dyspnea this is worse. At rest no dyspnea. But making bed makes her dyspnea. She feels she has worsening dyspnea. She is trying to be more active. Cough has persisted all along but this past week oin cipro for UTI and has improved cough.  Walking her today: walked 90 feet (1/2 lap) and desaturated to 87% and stopped.  Again expresses keen desire for palliation and symptom oriented treatment ("I have lived and am 87 now, good  enough"). RSI cough score is 10. Also sttates, eye drying and blurrded visiion worse after coming off prednisone. Does not want another immunomodulator including stand alone cellcept. Social: PMD Dr Lin Givens is moving out of town. New PMD will be Dr Alonna Buckler in [facility] with [facility]. She is also finding it tough to go to [facility] hepatology. So will change to Dr Lynnae Prude in [place], [place]; also with [facility] [phone]. Has caretaker but currently hse is off due  to pregnancy.   OV 02/17/2011 Followup for above. Since lst visit - ONO showed nocturnal desaturation Overnight pulse ox 12/30/10  now shos o2 at night to be below 88% all the time AND o2 1L [place] with sleep and exertion. Has lost weigh. BMI 6 weeks ago was 21.1 and now Body mass index is 19.57 kg/(m^2). Weight Loss, back pain, cough are  main issues. Feels hospice at [facility] will be good for her but does not feel she is there yet. Has cleaning help at hoome. Able to do ADLs  Past, Family, Social reviewed: no change since last visit     Review of Systems  Constitutional: Positive for unexpected weight change. Negative for fever.  HENT: Positive for trouble swallowing. Negative for ear pain, nosebleeds, congestion, sore throat, rhinorrhea, sneezing, dental problem, postnasal drip and sinus pressure.   Eyes: Positive for redness and itching.  Respiratory: Positive for cough and shortness of breath. Negative for chest tightness and wheezing.   Cardiovascular: Negative for palpitations and leg swelling.  Gastrointestinal: Negative for nausea and vomiting.  Genitourinary: Positive for urgency. Negative for dysuria.  Musculoskeletal: Positive for joint swelling.  Skin: Negative for rash.  Neurological: Negative for headaches.  Hematological: Does not bruise/bleed easily.  Psychiatric/Behavioral: Negative for dysphoric mood. The patient is not nervous/anxious.        Objective:   Physical Exam  Nursing note and vitals reviewed.  Constitutional: She is oriented to person, place, and time. She appears well-developed and well-nourished. No distress.  HENT:  Head: Normocephalic and atraumatic.  Right Ear: External ear normal.  Left Ear: External ear normal.  Mouth/Throat: Oropharynx is clear and moist. No oropharyngeal exudate.  Eyes: Conjunctivae and EOM are normal. Pupils are equal, round, and reactive to light. Right eye exhibits no discharge. Left eye exhibits no discharge. No scleral  icterus.  Neck: Normal range of motion. Neck supple. No JVD present. No tracheal deviation present. No thyromegaly present.  Cardiovascular: Normal rate, regular rhythm, normal heart sounds and intact distal pulses. Exam reveals no gallop and no friction rub.  No murmur heard.  Pulmonary/Chest: Effort normal. No respiratory distress. She has no wheezes. She has rales. She exhibits no tenderness.  Abdominal: Soft. Bowel sounds are normal. She exhibits no distension and no mass. There is no tenderness. There is no rebound and no guarding.  Musculoskeletal: Normal range of motion. She exhibits no edema and no tenderness.  Uses walker kyphotic  Lymphadenopathy:  She has no cervical adenopathy.  Neurological: She is alert and oriented to person, place, and time. She has normal reflexes. No cranial nerve deficit. She exhibits normal muscle tone. Coordination normal.  Skin: Skin is warm and dry. No rash noted. She is not diaphoretic. No erythema. No pallor.  Psychiatric: She has a normal mood and affect. Her behavior is normal. Judgment and thought content normal.  Somewhat flaat affect . More cheerful than before  Overall looks like she has lost weight  Assessment & Plan:

## 2011-03-10 ENCOUNTER — Observation Stay: Payer: Self-pay | Admitting: Internal Medicine

## 2011-03-10 LAB — URINALYSIS, COMPLETE
Bilirubin,UR: NEGATIVE
Blood: NEGATIVE
Glucose,UR: NEGATIVE mg/dL (ref 0–75)
Nitrite: NEGATIVE
Protein: NEGATIVE
Specific Gravity: 1.014 (ref 1.003–1.030)
WBC UR: 4 /HPF (ref 0–5)

## 2011-03-10 LAB — COMPREHENSIVE METABOLIC PANEL
Albumin: 2.8 g/dL — ABNORMAL LOW (ref 3.4–5.0)
Alkaline Phosphatase: 118 U/L (ref 50–136)
Bilirubin,Total: 1.4 mg/dL — ABNORMAL HIGH (ref 0.2–1.0)
Calcium, Total: 9.3 mg/dL (ref 8.5–10.1)
EGFR (African American): >60
EGFR (Non-African Amer.): 50 — ABNORMAL LOW
Glucose: 86 mg/dL (ref 65–99)
SGOT(AST): 34 U/L (ref 15–37)
Sodium: 138 mmol/L (ref 136–145)
Total Protein: 7.3 g/dL (ref 6.4–8.2)

## 2011-03-10 LAB — CBC
HGB: 13.7 g/dL (ref 12.0–16.0)
MCH: 34.2 pg — ABNORMAL HIGH (ref 26.0–34.0)
MCV: 101 fL — ABNORMAL HIGH (ref 80–100)
RBC: 4 10*6/uL (ref 3.80–5.20)
WBC: 4.9 10*3/uL (ref 3.6–11.0)

## 2011-03-10 LAB — TROPONIN I: Troponin-I: <0.02 ng/mL

## 2011-03-11 LAB — CBC WITH DIFFERENTIAL/PLATELET
Basophil %: 0.3 %
Eosinophil #: 0 10*3/uL (ref 0.0–0.7)
HCT: 34.9 % — ABNORMAL LOW (ref 35.0–47.0)
HGB: 11.8 g/dL — ABNORMAL LOW (ref 12.0–16.0)
Lymphocyte %: 31.1 %
MCHC: 33.8 g/dL (ref 32.0–36.0)
Monocyte %: 9.5 %
Neutrophil #: 2.1 10*3/uL (ref 1.4–6.5)
Neutrophil %: 58.1 %
Platelet: 94 10*3/uL — ABNORMAL LOW (ref 150–440)
RBC: 3.47 10*6/uL — ABNORMAL LOW (ref 3.80–5.20)
WBC: 3.7 10*3/uL (ref 3.6–11.0)

## 2011-03-11 LAB — BASIC METABOLIC PANEL
Calcium, Total: 8.1 mg/dL — ABNORMAL LOW (ref 8.5–10.1)
Chloride: 108 mmol/L — ABNORMAL HIGH (ref 98–107)
Co2: 27 mmol/L (ref 21–32)
EGFR (Non-African Amer.): 55 — ABNORMAL LOW
Glucose: 96 mg/dL (ref 65–99)
Osmolality: 286 (ref 275–301)
Potassium: 4.8 mmol/L (ref 3.5–5.1)
Sodium: 142 mmol/L (ref 136–145)

## 2011-03-11 LAB — PROTIME-INR: Prothrombin Time: 16.7 secs — ABNORMAL HIGH (ref 11.5–14.7)

## 2011-03-12 LAB — URINE CULTURE

## 2011-03-15 LAB — CULTURE, BLOOD (SINGLE)

## 2011-05-20 ENCOUNTER — Ambulatory Visit: Payer: Medicare Other | Admitting: Internal Medicine

## 2011-07-11 ENCOUNTER — Emergency Department: Payer: Self-pay | Admitting: Emergency Medicine

## 2011-07-21 DEATH — deceased

## 2011-08-30 IMAGING — CR DG CHEST 1V PORT
1 series · 1 of 1 positions shown · non-contrast
Comparison: 12/02/2005

CLINICAL DATA: Postop post bronchoscopy rule out pneumothorax

PORTABLE CHEST - 1 VIEW

[view not recorded]
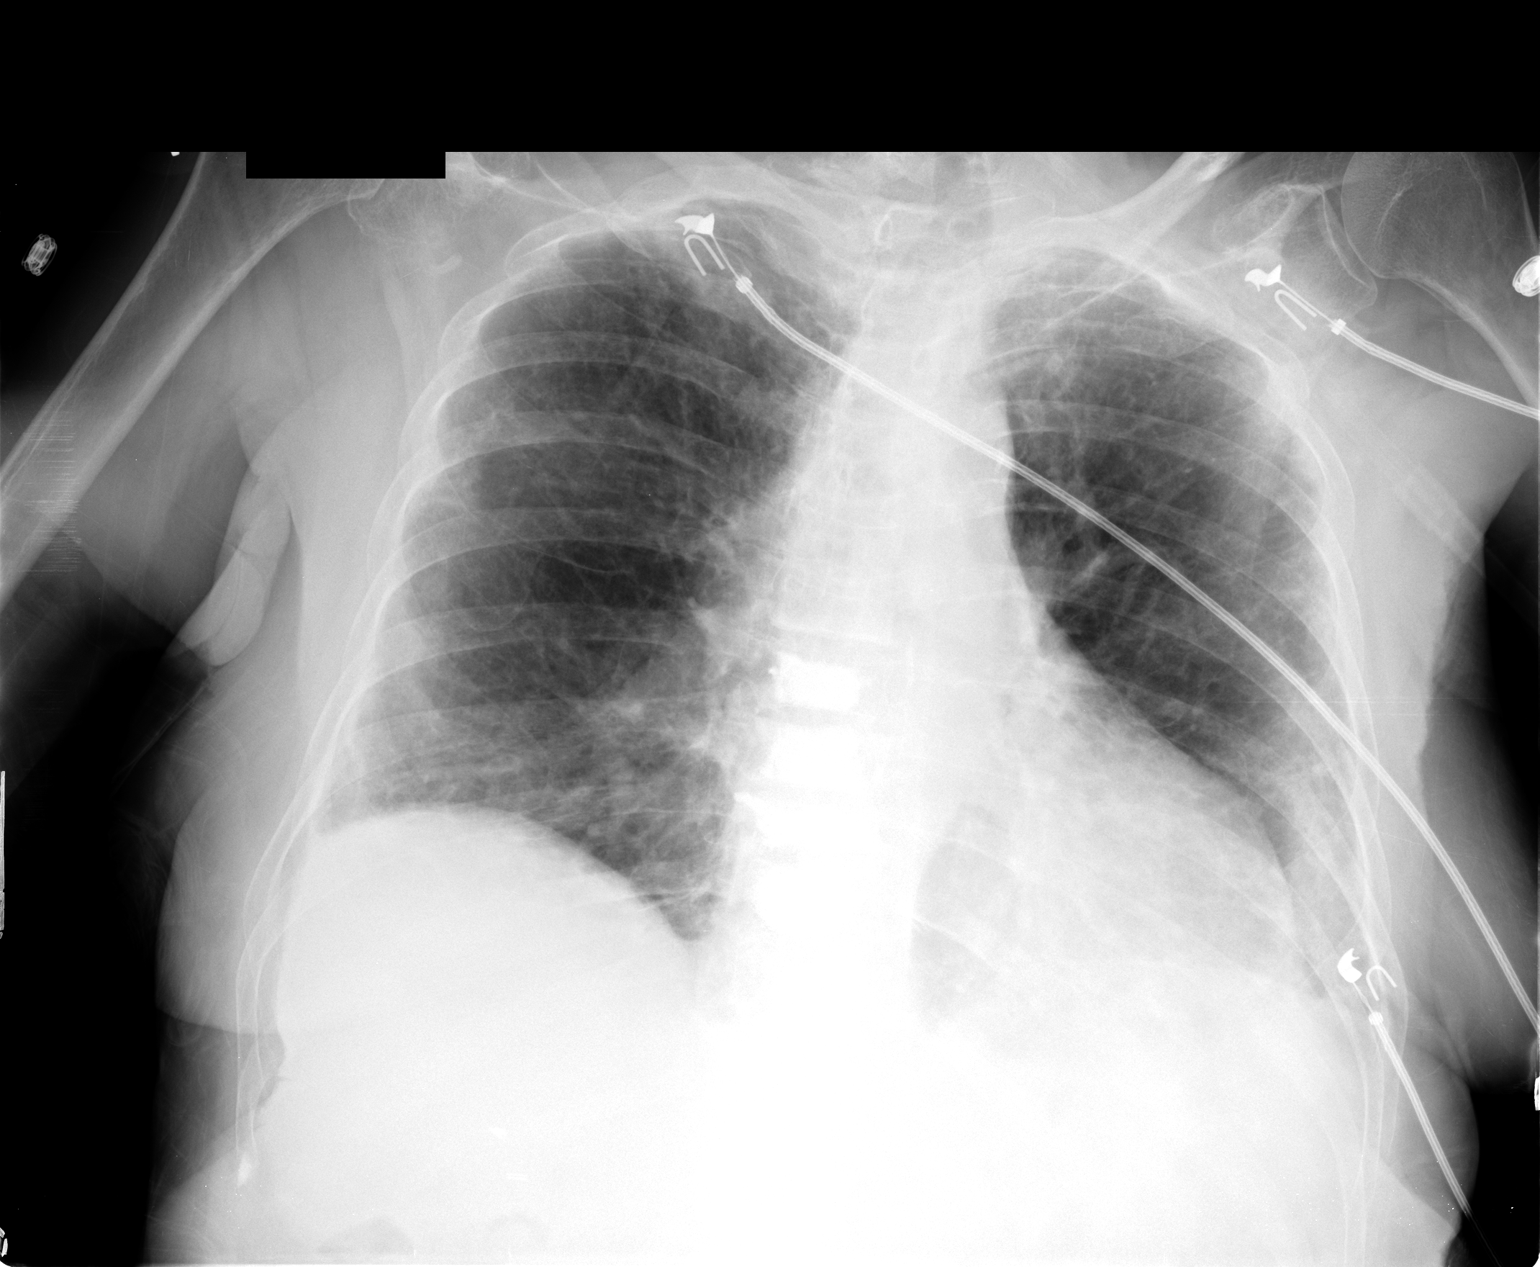

[1 of 1 positions shown; findings below may reference images not displayed]

FINDINGS: Borderline cardiomegaly noted.  Bilateral peripheral
fibrotic changes and peripheral mild interstitial prominence
probable chronic in nature.  No convincing pulmonary edema.  Prior
vertebroplasty multilevel lower thoracic spine.  No acute
infiltrate or edema.  No diagnostic pneumothorax.
IMPRESSION: No acute disease or diagnostic pneumothorax.  Probable fibrotic
changes and peripheral mild interstitial prominence chronic in
nature.

## 2012-07-19 ENCOUNTER — Telehealth: Payer: Self-pay | Admitting: Internal Medicine

## 2012-07-19 NOTE — Telephone Encounter (Signed)
Not seen her in 18 months. Please find out what is going on . Give fU    Dr. Kalman Shan, M.D., [facility].C.P Pulmonary and Critical Care Medicine Staff Physician [facility] Pulmonary and Critical Care Pager: [phone], If no answer or between  15:00h - 7:00h: call [phone]  07/19/2012 11:56 AM

## 2012-08-04 ENCOUNTER — Encounter: Payer: Self-pay | Admitting: *Deleted

## 2012-08-04 NOTE — Telephone Encounter (Signed)
Numbers are disconnected so I will send a letter asking pt to call for an appt. Carron Curie, CMA

## 2012-08-16 ENCOUNTER — Telehealth: Payer: Self-pay | Admitting: Internal Medicine

## 2012-08-16 NOTE — Telephone Encounter (Signed)
Well she is one of my ILD patients who never showed up for FU and chart did not say deceased.  I sent a note to triage/Jennifer 07/19/12 what was going on. Thanks for update  Dr. Kalman Shan, M.D., [facility].C.P Pulmonary and Critical Care Medicine Staff Physician [facility] Pulmonary and Critical Care Pager: [phone], If no answer or between  15:00h - 7:00h: call [phone]  08/16/2012 9:40 AM

## 2012-08-16 NOTE — Telephone Encounter (Signed)
Noted and I called [facility] [place] stairs to have this documented in [place] so this does not happen again. Will forward to MR as well.

## 2014-05-14 NOTE — Discharge Summary (Signed)
PATIENT NAME:  [name], [name] MR#:  [id number] DATE OF BIRTH:  1923-06-07  DATE OF ADMISSION:  03/10/2011 DATE OF DISCHARGE:  03/11/2011   DISCHARGE DIAGNOSES:  1. Weakness. 2. Failure to thrive. 3. Hypertension.  4. History of chronic abdominal pain.  5. History of osteoporosis. 6. Hyperlipidemia. 7. Cirrhosis of the liver. 8. Chronic back pain.   DISCHARGE MEDICATIONS:  1. Acetaminophen with hydrocodone 5/325 1 tablet p.o. t.i.d. as needed.  2. Calcium with Vitamin D 1 tablet p.o. b.i.d.  3. Cyanocobalamin 1000 mcg IM every month. 4. Fluticasone two sprays in each nostril once a day. 5. HCTZ/triamterene 25/37.5 one tablet p.o. daily.  6. Nystatin 100,000 international units/gram topical powder b.i.d.  7. Paxil 12.5 mg p.o. daily.  8. PreserVision 1 tablet as needed. 9. Reclast 5 mg IV injection as directed.   ALLERGIES: None.   CODE STATUS: DO NOT RESUSCITATE.   CONSULTATIONS: None.   LAB DATA: Magnesium 1.6 which was replaced. Troponin less than 0.02.   Electrolytes on admission sodium 138, potassium 3.9, chloride 100, bicarb 31, BUN 26, creatinine 1.11, glucose 86. Blood cultures have been negative. Chest x-ray showed density in the left base compatible with minimal infiltrate versus pneumonia. The patient's urine cultures have been negative. INR is normal.   Electrolytes this morning: Sodium 142, potassium 4.8, chloride 108, BUN 22, creatinine 1, glucose 96, WBC 3.7, hemoglobin 11.8, hematocrit 34.9, platelets 94. Chest x-ray repeated this morning showed increased density in the left base compatible with fibrosis. No definite acute pneumonia.   DISCHARGE VITALS: Blood pressure 124/57, pulse 78, respirations 20, temperature 98.3.   HOSPITAL COURSE: The patient is an 81 year old female patient with multiple medical problems of hypertension and hyperlipidemia admitted because she was feeling very weak with poor appetite for several weeks. The patient started to follow with  Hospice at home and because she was not able to be managed at home the family brought her here for placement. The patient's lab data showed only hypokalemia and hypomagnesemia which were replaced. Other than that, she had no abnormal labs. The patient was started on Megace because of poor appetite and this morning her appetite is better and eating a little bit. She was seen by case Production designer, theatre/television/filmmanager. The patient has been referred for placement and case manager is working on placement [facility]. The patient can be discharged when [facility] a [facility].   CONDITION: Stable at this time.   CODE STATUS: DO NOT RESUSCITATE.   The patient will be started on Megace.   TOTAL TIME SPENT ON DISCHARGE PREPARATION: More than 30 minutes.   ____________________________ Katha HammingSnehalatha Jernie Schutt, MD sk:drc D: 03/11/2011 12:24:25 ET T: 03/11/2011 13:05:12 ET JOB#: [id number]  cc: Katha HammingSnehalatha Andreka Stucki, MD, <Dictator> Katha HammingSNEHALATHA Layken Doenges MD ELECTRONICALLY SIGNED 03/11/2011 13:28

## 2014-05-14 NOTE — H&P (Signed)
PATIENT NAME:  Mitzi HansenOAKLEY, Anona R MR#:  [id number] DATE OF BIRTH:  [birth year]  DATE OF ADMISSION:  03/10/2011  REQUESTING PHYSICIAN: Malachy Moanevainder Goli, MD    PRIMARY CARE PHYSICIAN: Alonna BucklerAndrew Lamb, MD   CHIEF COMPLAINT: Weakness.   HISTORY OF PRESENT ILLNESS: The patient is an 81 year old female with a known history of hypertension, hyperlipidemia, is being admitted for suspected failure to thrive. The patient has been feeling very weak and has had poor appetite for the last several weeks, has recently started being followed by [facility]ospice at home but cannot manage herself at home as she lives alone. Her family her brought into the Emergency Department. She will need to be placed, and Care Management is aware that. She is being admitted for further evaluation and management.   PAST MEDICAL HISTORY:  1. Hypertension.  2. Hyperlipidemia.  3. Chronic back pain.  4. Osteoporosis.  5. Suspected Sjogren syndrome.  6. Cirrhosis of the liver.  7. Chronic abdominal pain.   PAST SURGICAL HISTORY:  1. Left eye laser surgery for detached retina.  2. Lumbar laminectomy.  3. Cataract surgery.  4. Laparoscopic cholecystectomy.  5. Liver biopsy.   MEDICATIONS AT HOME:  1. Acetaminophen/hydrocodone 325/5, 1 tablet p.o. t.i.d. as needed.  2. Calcium with vitamin D, 1 tablet p.o. b.i.d.  3. Cyanocobalamin 1000 mcg intramuscular once a month.  4. Fluticasone two sprays in both nostrils once a day.  5. Hydrochlorothiazide/triamterene 25/37.5, 1 tablet p.o. daily.  6. Multivitamin 1 tablet p.o. daily. 7. Nystatin 100,000 units/gram topical powder b.i.d. 8. Paroxetine 12.5 mg p.o. daily.  9. PreserVision once a day as needed.  10. Reclast 5 mg intravenous single dose as directed.   ALLERGIES: No known drug allergies.   SOCIAL HISTORY: No smoking. No alcohol. She is a retired Engineer, civil (consulting)nurse.   FAMILY HISTORY: Father and mother had diabetes. Mother also had an amputation of the leg.   REVIEW OF SYSTEMS: CONSTITUTIONAL:  No fever. Positive for fatigue and weakness. EYES: No blurred or double vision. ENT: No tinnitus or ear pain. RESPIRATORY: No cough, wheezing, or hemoptysis. RESPIRATORY: Positive for cough with thick yellowish sputum. CARDIOVASCULAR: No chest pain, orthopnea, or edema. GASTROINTESTINAL: No nausea, vomiting, or diarrhea. Poor appetite. GU: No dysuria or hematuria. ENDOCRINE: No polyuria or nocturia. HEMATOLOGY: No anemia or easy bruising. SKIN: No rash or lesion. MUSCULOSKELETAL: Positive arthritis and osteoporosis. NEUROLOGICAL: No tingling, numbness, or weakness. PSYCHIATRIC: No history of anxiety or depression.   PHYSICAL EXAMINATION:  VITAL SIGNS: Temperature 97.7, heart rate 90 per minute, respirations 18 per minute, blood pressure 163/70 mmHg. She is saturating 96% on room air.   GENERAL: The patient is an 81 year old female lying in the bed comfortably without any acute distress.   HEENT: Eyes: Pupils are equal, round, reactive to light and accommodation. No scleral icterus. Extraocular muscles are intact. HENT: Head atraumatic, normocephalic. Oropharynx and nasopharynx clear.   NECK: Supple. No jugular venous distention. No thyroid enlargement or tenderness.   LUNGS: Clear to auscultation bilaterally. No wheezing, rales, rhonchi, or crepitation.   HEART: S1, S2 normal. No murmurs, rubs, or gallop.  ABDOMEN: Soft, nontender, nondistended. Bowel sounds are present. No organomegaly or mass.   EXTREMITIES: No pedal edema, cyanosis or clubbing.   NEUROLOGICAL: Nonfocal examination. Cranial nerves III through XII intact. Muscle strength 5 out of 5 in extremities. Sensation intact.   PSYCHIATRIC: The patient is oriented to time, place, and person x3.   SKIN: No obvious rash, lesion, or ulcer.   LABORATORY, DIAGNOSTIC  AND RADIOLOGICAL DATA:  Normal BMP except potassium of 3.1. Normal liver function tests. Normal CBC. Normal first set of cardiac enzymes.  Chest x-ray showed no acute  cardiopulmonary disease. EKG showed normal sinus rhythm. No major ST-T wave changes.   IMPRESSION AND PLAN:  1. Failure to thrive: Likely multifactorial, followed by [facility]; but she is not able to manage herself at home and will need placement. Care Management is aware and is working on it, but she will likely need to be admitted for observation until all her process issues have been taken care of and specific placement where her family wants her to go can be found. We will start her on Megace to see if it helps her poor appetite stimulation. We will also consult Physical Therapy to work with her while in the hospital.  2. Chronic abdominal pain: We will start her on Bentyl to see if it helps.  3. Hypertension: We will continue home medications.  4. Hypokalemia.: We will replete and recheck, check magnesium level also.   CODE STATUS: DO NOT RESUSCITATE. She is already followed by Hospice.   TIME TAKEN: Total time taking care of this patient is 45 minutes.   ____________________________ Ellamae Sia. Sherryll Burger, MD vss:cbb D: 03/10/2011 16:37:32 ET T: 03/10/2011 17:16:21 ET JOB#: [id number] cc: Reola Mosher. Randa Lynn, MD Ellamae Sia [facility] MD ELECTRONICALLY SIGNED 03/11/2011 12:03
